# Patient Record
Sex: Female | Born: 1985 | Hispanic: Yes | Marital: Single | Smoking: Never smoker
Health system: Southern US, Community
[De-identification: ages and names within clinical notes are randomized; demographics above are authoritative.]

## PROBLEM LIST (undated history)

## (undated) ENCOUNTER — Inpatient Hospital Stay (HOSPITAL_COMMUNITY): Payer: Self-pay

## (undated) DIAGNOSIS — D509 Iron deficiency anemia, unspecified: Secondary | ICD-10-CM

## (undated) DIAGNOSIS — Z973 Presence of spectacles and contact lenses: Secondary | ICD-10-CM

## (undated) DIAGNOSIS — O139 Gestational [pregnancy-induced] hypertension without significant proteinuria, unspecified trimester: Secondary | ICD-10-CM

## (undated) DIAGNOSIS — O24419 Gestational diabetes mellitus in pregnancy, unspecified control: Secondary | ICD-10-CM

## (undated) DIAGNOSIS — N939 Abnormal uterine and vaginal bleeding, unspecified: Secondary | ICD-10-CM

## (undated) DIAGNOSIS — Z789 Other specified health status: Secondary | ICD-10-CM

## (undated) HISTORY — PX: BREAST ENHANCEMENT SURGERY: SHX7

## (undated) HISTORY — DX: Other specified health status: Z78.9

## (undated) HISTORY — PX: NO PAST SURGERIES: SHX2092

---
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
## 2007-01-26 ENCOUNTER — Inpatient Hospital Stay: Payer: Self-pay | Admitting: Obstetrics and Gynecology

## 2008-07-31 ENCOUNTER — Inpatient Hospital Stay: Payer: Self-pay | Admitting: Obstetrics and Gynecology

## 2011-04-19 ENCOUNTER — Ambulatory Visit: Payer: Self-pay | Admitting: Family Medicine

## 2011-05-28 ENCOUNTER — Ambulatory Visit: Payer: Self-pay | Admitting: Advanced Practice Midwife

## 2012-08-14 ENCOUNTER — Ambulatory Visit: Payer: Self-pay | Admitting: Hematology and Oncology

## 2012-08-25 ENCOUNTER — Ambulatory Visit: Payer: Self-pay | Admitting: Hematology and Oncology

## 2012-08-25 LAB — CBC CANCER CENTER
Basophil #: 0 x10 3/mm (ref 0.0–0.1)
Basophil %: 0.2 %
Eosinophil #: 0 x10 3/mm (ref 0.0–0.7)
Eosinophil %: 0.3 %
HCT: 26 % — ABNORMAL LOW (ref 35.0–47.0)
HGB: 8.2 g/dL — ABNORMAL LOW (ref 12.0–16.0)
Lymphocyte #: 1.7 x10 3/mm (ref 1.0–3.6)
Lymphocyte %: 28 %
MCH: 22.5 pg — ABNORMAL LOW (ref 26.0–34.0)
MCHC: 31.5 g/dL — ABNORMAL LOW (ref 32.0–36.0)
MCV: 72 fL — ABNORMAL LOW (ref 80–100)
Monocyte #: 0.4 x10 3/mm (ref 0.2–0.9)
Monocyte %: 6.9 %
Neutrophil #: 4 x10 3/mm (ref 1.4–6.5)
Neutrophil %: 64.6 %
Platelet: 240 x10 3/mm (ref 150–440)
RBC: 3.64 10*6/uL — ABNORMAL LOW (ref 3.80–5.20)
RDW: 19.5 % — ABNORMAL HIGH (ref 11.5–14.5)
WBC: 6.2 x10 3/mm (ref 3.6–11.0)

## 2012-09-02 ENCOUNTER — Inpatient Hospital Stay: Payer: Self-pay

## 2012-09-03 LAB — CBC WITH DIFFERENTIAL/PLATELET
Basophil #: 0 10*3/uL (ref 0.0–0.1)
Basophil %: 0.3 %
Eosinophil #: 0 10*3/uL (ref 0.0–0.7)
Eosinophil %: 0.4 %
HCT: 28.6 % — ABNORMAL LOW (ref 35.0–47.0)
HGB: 9.3 g/dL — ABNORMAL LOW (ref 12.0–16.0)
Lymphocyte #: 2.3 10*3/uL (ref 1.0–3.6)
Lymphocyte %: 29.7 %
MCH: 24.4 pg — ABNORMAL LOW (ref 26.0–34.0)
MCHC: 32.5 g/dL (ref 32.0–36.0)
MCV: 75 fL — ABNORMAL LOW (ref 80–100)
Monocyte #: 0.5 x10 3/mm (ref 0.2–0.9)
Monocyte %: 6.9 %
Neutrophil #: 4.8 10*3/uL (ref 1.4–6.5)
Neutrophil %: 62.7 %
Platelet: 227 10*3/uL (ref 150–440)
RBC: 3.82 10*6/uL (ref 3.80–5.20)
RDW: 20 % — ABNORMAL HIGH (ref 11.5–14.5)
WBC: 7.6 10*3/uL (ref 3.6–11.0)

## 2012-09-04 LAB — HEMATOCRIT: HCT: 23.8 % — ABNORMAL LOW (ref 35.0–47.0)

## 2012-09-20 ENCOUNTER — Ambulatory Visit: Payer: Self-pay | Admitting: Hematology and Oncology

## 2012-09-22 LAB — CBC CANCER CENTER
Basophil #: 0 x10 3/mm (ref 0.0–0.1)
Basophil %: 0.4 %
Eosinophil #: 0 x10 3/mm (ref 0.0–0.7)
Eosinophil %: 0.8 %
HCT: 33.1 % — ABNORMAL LOW (ref 35.0–47.0)
HGB: 10.7 g/dL — ABNORMAL LOW (ref 12.0–16.0)
Lymphocyte #: 1.8 x10 3/mm (ref 1.0–3.6)
Lymphocyte %: 33.8 %
MCH: 26 pg (ref 26.0–34.0)
MCHC: 32.3 g/dL (ref 32.0–36.0)
MCV: 81 fL (ref 80–100)
Monocyte #: 0.3 x10 3/mm (ref 0.2–0.9)
Monocyte %: 5.3 %
Neutrophil #: 3.1 x10 3/mm (ref 1.4–6.5)
Neutrophil %: 59.7 %
Platelet: 175 x10 3/mm (ref 150–440)
RBC: 4.1 10*6/uL (ref 3.80–5.20)
RDW: 28.5 % — ABNORMAL HIGH (ref 11.5–14.5)
WBC: 5.3 x10 3/mm (ref 3.6–11.0)

## 2012-10-21 ENCOUNTER — Ambulatory Visit: Payer: Self-pay | Admitting: Hematology and Oncology

## 2013-01-12 ENCOUNTER — Ambulatory Visit: Payer: Self-pay | Admitting: Hematology and Oncology

## 2015-02-28 NOTE — H&P (Signed)
L&D Evaluation:  History:   HPI 29 y/o [id number] @ 38/5 wks EDC 09/11/12 arrives with c/o contractions beginning this evening, denies leakiing fluid or vaginal bleeding, baby is actrive. Cae @ [facility] HX LGA infants 3-8#+infants and 9#14. HX PPH after 1 delivery and after SAB. HX PPD 2nd baby. GBS negative.    Presents with contractions    Patient's Medical History No Chronic Illness    Patient's Surgical History none    Medications Pre Natal Vitamins    Allergies NKDA    Social History none    Family History Non-Contributory   ROS:   ROS All systems were reviewed.  HEENT, CNS, GI, GU, Respiratory, CV, Renal and Musculoskeletal systems were found to be normal.   Exam:   Vital Signs stable    Urine Protein not completed    General no apparent distress    Mental Status clear    Chest clear    Heart normal sinus rhythm    Abdomen gravid, non-tender    Estimated Fetal Weight Average for gestational age    Fetal Position vtx    Fundal Height term    Back no CVAT    Edema no edema    Reflexes 1+    Clonus negative    Pelvic no external lesions, 5cm vtx @ -1 well applied  AROM clear fluid nl show    Description clear    FHT normal rate with no decels, baseline 130's ag variability with accels    Fetal Heart Rate 136    Ucx regular, q 2/3 mins 45/60 sec moderate    Skin dry    Lymph no lymphadenopathy   Impression:   Impression active labor   Plan:   Plan monitor contractions and for cervical change    Comments Admitted, knows what to expect. Requests epidural, anesthesia notified. FOB supportive at bedside.   Electronic Signatures: Rosie Fate (CNM)  (Signed [phone] 01:04)  Authored: L&D Evaluation   Last Updated: 14-Nov-13 01:04 by Rosie Fate (CNM)

## 2016-01-22 ENCOUNTER — Emergency Department: Payer: Self-pay

## 2016-01-22 ENCOUNTER — Encounter: Payer: Self-pay | Admitting: Emergency Medicine

## 2016-01-22 ENCOUNTER — Emergency Department
Admission: EM | Admit: 2016-01-22 | Discharge: 2016-01-22 | Disposition: A | Discharge status: Home / Self Care | Payer: Self-pay | Attending: Emergency Medicine | Admitting: Emergency Medicine

## 2016-01-22 DIAGNOSIS — N12 Tubulo-interstitial nephritis, not specified as acute or chronic: Secondary | ICD-10-CM | POA: Insufficient documentation

## 2016-01-22 DIAGNOSIS — R1011 Right upper quadrant pain: Secondary | ICD-10-CM | POA: Insufficient documentation

## 2016-01-22 LAB — URINALYSIS COMPLETE WITH MICROSCOPIC (ARMC ONLY)
Bilirubin Urine: NEGATIVE
Glucose, UA: NEGATIVE mg/dL
Ketones, ur: NEGATIVE mg/dL
Nitrite: POSITIVE — AB
Protein, ur: 100 mg/dL — AB
Specific Gravity, Urine: 1.016 (ref 1.005–1.030)
pH: 8 (ref 5.0–8.0)

## 2016-01-22 LAB — HEPATIC FUNCTION PANEL
ALT: 26 U/L (ref 14–54)
AST: 23 U/L (ref 15–41)
Albumin: 4.1 g/dL (ref 3.5–5.0)
Alkaline Phosphatase: 60 U/L (ref 38–126)
Bilirubin, Direct: 0.1 mg/dL (ref 0.1–0.5)
Indirect Bilirubin: 0.4 mg/dL (ref 0.3–0.9)
Total Bilirubin: 0.5 mg/dL (ref 0.3–1.2)
Total Protein: 8.2 g/dL — ABNORMAL HIGH (ref 6.5–8.1)

## 2016-01-22 LAB — BASIC METABOLIC PANEL
Anion gap: 6 (ref 5–15)
BUN: 10 mg/dL (ref 6–20)
CO2: 26 mmol/L (ref 22–32)
Calcium: 9.2 mg/dL (ref 8.9–10.3)
Chloride: 101 mmol/L (ref 101–111)
Creatinine, Ser: 0.52 mg/dL (ref 0.44–1.00)
GFR calc Af Amer: >60 mL/min (ref >60)
GFR calc non Af Amer: >60 mL/min (ref >60)
Glucose, Bld: 101 mg/dL — ABNORMAL HIGH (ref 65–99)
Potassium: 3.1 mmol/L — ABNORMAL LOW (ref 3.5–5.1)
Sodium: 133 mmol/L — ABNORMAL LOW (ref 135–145)

## 2016-01-22 LAB — CBC
HCT: 37.1 % (ref 35.0–47.0)
Hemoglobin: 12.8 g/dL (ref 12.0–16.0)
MCH: 30.3 pg (ref 26.0–34.0)
MCHC: 34.5 g/dL (ref 32.0–36.0)
MCV: 87.9 fL (ref 80.0–100.0)
Platelets: 207 10*3/uL (ref 150–440)
RBC: 4.22 MIL/uL (ref 3.80–5.20)
RDW: 13.8 % (ref 11.5–14.5)
WBC: 9 10*3/uL (ref 3.6–11.0)

## 2016-01-22 LAB — LIPASE, BLOOD: Lipase: 18 U/L (ref 11–51)

## 2016-01-22 LAB — POCT PREGNANCY, URINE: Preg Test, Ur: NEGATIVE

## 2016-01-22 MED ORDER — HYDROMORPHONE HCL 1 MG/ML IJ SOLN
INTRAMUSCULAR | Status: AC
  Filled 2016-01-22: qty 1

## 2016-01-22 MED ORDER — ONDANSETRON 4 MG PO TBDP
8.0000 mg | ORAL_TABLET | Freq: Once | ORAL | Status: AC
  Administered 2016-01-22: 8 mg via ORAL

## 2016-01-22 MED ORDER — SULFAMETHOXAZOLE-TRIMETHOPRIM 800-160 MG PO TABS
ORAL_TABLET | ORAL | Status: AC
  Filled 2016-01-22: qty 1

## 2016-01-22 MED ORDER — SULFAMETHOXAZOLE-TRIMETHOPRIM 800-160 MG PO TABS
1.0000 | ORAL_TABLET | Freq: Once | ORAL | Status: AC
  Administered 2016-01-22: 1 via ORAL

## 2016-01-22 MED ORDER — ONDANSETRON 4 MG PO TBDP
ORAL_TABLET | ORAL | Status: AC
  Filled 2016-01-22: qty 2

## 2016-01-22 MED ORDER — HYDROMORPHONE HCL 1 MG/ML IJ SOLN
1.0000 mg | Freq: Once | INTRAMUSCULAR | Status: AC
Start: 2016-01-22 — End: 2016-01-22
  Administered 2016-01-22: 1 mg via INTRAMUSCULAR

## 2016-01-22 MED ORDER — PROMETHAZINE HCL 25 MG PO TABS: 25.0000 mg | ORAL_TABLET | Freq: Four times a day (QID) | ORAL | Status: DC | PRN

## 2016-01-22 MED ORDER — SULFAMETHOXAZOLE-TRIMETHOPRIM 800-160 MG PO TABS: 1.0000 | ORAL_TABLET | Freq: Two times a day (BID) | ORAL | Status: DC

## 2016-01-22 MED ORDER — OXYCODONE-ACETAMINOPHEN 5-325 MG PO TABS
1.0000 | ORAL_TABLET | Freq: Four times a day (QID) | ORAL | Status: DC | PRN
Start: 2016-01-22 — End: 2016-05-02

## 2016-01-22 NOTE — ED Notes (Signed)
Pt reports fever and right sided flank pain x2 days, reports nausea and decreased urination.

## 2016-01-22 NOTE — ED Notes (Signed)
UA Preg = Negative

## 2016-01-22 NOTE — ED Notes (Signed)
Pt presents to ED with c/o urinary frequency since last Friday morning. Pt reports right flank and abdominal pain, denies painful urination or hesitancy. Pt denies any similar s/x's, denies h/x of kidney stones or renal dysfunction.

## 2016-01-22 NOTE — Discharge Instructions (Signed)
You were prescribed a medication that is potentially sedating. Do not drink alcohol, drive or participate in any other potentially dangerous activities while taking this medication as it may make you sleepy. Do not take this medication with any other sedating medications, either prescription or over-the-counter. If you were prescribed Percocet or Vicodin, do not take these with acetaminophen (Tylenol) as it is already contained within these medications.   Opioid pain medications (or "narcotics") can be habit forming.  Use it as little as possible to achieve adequate pain control.  Do not use or use it with extreme caution if you have a history of opiate abuse or dependence.  If you are on a pain contract with your primary care doctor or a pain specialist, be sure to let them know you were prescribed this medication today from the [facility] Emergency Department.  This medication is intended for your use only - do not give any to anyone else and keep it in a secure place where nobody else, especially children and pets, have access to it.  It will also cause or worsen constipation, so you may want to consider taking an over-the-counter stool softener while you are taking this medication.  Pyelonephritis, Adult Pyelonephritis is a kidney infection. The kidneys are the organs that filter a person's blood and move waste out of the bloodstream and into the urine. Urine passes from the kidneys, through the ureters, and into the bladder. There are two main types of pyelonephritis:  Infections that come on quickly without any warning (acute pyelonephritis).  Infections that last for a long period of time (chronic pyelonephritis). In most cases, the infection clears up with treatment and does not cause further problems. More severe infections or chronic infections can sometimes spread to the bloodstream or lead to other problems with the kidneys. CAUSES This condition is usually caused by:  Bacteria  traveling from the bladder to the kidney through infected urine. The urine in the bladder can become infected with bacteria from:  Bladder infection (cystitis).  Inflammation of the prostate gland (prostatitis).  Sexual intercourse, in females.  Bacteria traveling from the bloodstream to the kidney. RISK FACTORS This condition is more likely to develop in:  Pregnant women.  Older people.  People who have diabetes.  People who have kidney stones or bladder stones.  People who have other abnormalities of the kidney or ureter.  People who have a catheter placed in the bladder.  People who have cancer.  People who are sexually active.  Women who use spermicides.  People who have had a prior urinary tract infection. SYMPTOMS Symptoms of this condition include:  Frequent urination.  Strong or persistent urge to urinate.  Burning or stinging when urinating.  Abdominal pain.  Back pain.  Pain in the side or flank area.  Fever.  Chills.  Blood in the urine, or dark urine.  Nausea.  Vomiting. DIAGNOSIS This condition may be diagnosed based on:  Medical history and physical exam.  Urine tests.  Blood tests. You may also have imaging tests of the kidneys, such as an ultrasound or CT scan. TREATMENT Treatment for this condition may depend on the severity of the infection.  If the infection is mild and is found early, you may be treated with antibiotic medicines taken by mouth. You will need to drink fluids to remain hydrated.  If the infection is more severe, you may need to stay in the hospital and receive antibiotics given directly into a vein through an  IV tube. You may also need to receive fluids through an IV tube if you are not able to remain hydrated. After your hospital stay, you may need to take oral antibiotics for a period of time. Other treatments may be required, depending on the cause of the infection. HOME CARE INSTRUCTIONS Medicines  Take  over-the-counter and prescription medicines only as told by your health care provider.  If you were prescribed an antibiotic medicine, take it as told by your health care provider. Do not stop taking the antibiotic even if you start to feel better. General Instructions  Drink enough fluid to keep your urine clear or pale yellow.  Avoid caffeine, tea, and carbonated beverages. They tend to irritate the bladder.  Urinate often. Avoid holding in urine for long periods of time.  Urinate before and after sex.  After a bowel movement, women should cleanse from front to back. Use each tissue only once.  Keep all follow-up visits as told by your health care provider. This is important. SEEK MEDICAL CARE IF:  Your symptoms do not get better after 2 days of treatment.  Your symptoms get worse.  You have a fever. SEEK IMMEDIATE MEDICAL CARE IF:  You are unable to take your antibiotics or fluids.  You have shaking chills.  You vomit.  You have severe flank or back pain.  You have extreme weakness or fainting.   This information is not intended to replace advice given to you by your health care provider. Make sure you discuss any questions you have with your health care provider.   Document Released: 10/07/2005 Document Revised: 06/28/2015 Document Reviewed: 01/30/2015 Elsevier Interactive Patient Education [employer].

## 2016-01-22 NOTE — ED Provider Notes (Signed)
[facility] Emergency Department Provider Note  ____________________________________________  Time seen: 6:50 PM  I have reviewed the triage vital signs and the nursing notes.   HISTORY  Chief Complaint Flank Pain    HPI Carrie Cameron is a 30 y.o. female who complains of urinary frequency and urgency for the past 3-4 days. No history of kidney stones. Pain has progressed to the right flank and right side of the abdomen. Has some nausea but no vomiting. Positive subjective fever over the past 2 days as well. No aggravating or alleviating factors to the abdominal pain. It's moderate intensity, aching, constant and gradually worsening.     History reviewed. No pertinent past medical history.   There are no active problems to display for this patient.    History reviewed. No pertinent past surgical history.   Current Outpatient Rx  Name  Route  Sig  Dispense  Refill  . oxyCODONE-acetaminophen (ROXICET) 5-325 MG tablet   Oral   Take 1 tablet by mouth every 6 (six) hours as needed for severe pain.   12 tablet   0   . promethazine (PHENERGAN) 25 MG tablet   Oral   Take 1 tablet (25 mg total) by mouth every 6 (six) hours as needed for nausea or vomiting.   15 tablet   0   . sulfamethoxazole-trimethoprim (BACTRIM DS) 800-160 MG tablet   Oral   Take 1 tablet by mouth 2 (two) times daily.   14 tablet   0      Allergies Review of patient's allergies indicates no known allergies.   No family history on file.  Social History Social History  Substance Use Topics  . Smoking status: Never Smoker   . Smokeless tobacco: None  . Alcohol Use: No    Review of Systems  Constitutional:   Positive fever and chills. No weight changes Eyes:   No vision changes.  ENT:   No sore throat. No rhinorrhea. Cardiovascular:   No chest pain. Respiratory:   No dyspnea or cough. Gastrointestinal:   Positive right-sided abdominal pain without  vomiting and diarrhea.  No BRBPR or melena. Genitourinary:   Positive urinary frequency without dysuria or hematuria Musculoskeletal:   Negative for focal pain or swelling Skin:   Negative for rash. Neurological:   Negative for headaches, focal weakness or numbness.  10-point ROS otherwise negative.  ____________________________________________   PHYSICAL EXAM:  VITAL SIGNS: ED Triage Vitals  Enc Vitals Group     BP 01/22/16 1803 118/84 mmHg     Pulse Rate 01/22/16 1803 88     Resp 01/22/16 1803 16     Temp 01/22/16 1803 99.5 F (37.5 C)     Temp Source 01/22/16 1803 Oral     SpO2 01/22/16 1803 100 %     Weight 01/22/16 1803 147 lb (66.679 kg)     Height 01/22/16 1803 5' (1.524 m)     Head Cir --      Peak Flow --      Pain Score 01/22/16 1803 8     Pain Loc --      Pain Edu? --      Excl. in [place]? --     Vital signs reviewed, nursing assessments reviewed.   Constitutional:   Alert and oriented. Well appearing and in no distress. Eyes:   No scleral icterus. No conjunctival pallor. PERRL. EOMI ENT   Head:   Normocephalic and atraumatic.   Nose:   No congestion/rhinnorhea.  No septal hematoma   Mouth/Throat:   MMM, no pharyngeal erythema. No peritonsillar mass.    Neck:   No stridor. No SubQ emphysema. No meningismus. Hematological/Lymphatic/Immunilogical:   No cervical lymphadenopathy. Cardiovascular:   RRR. Symmetric bilateral radial and DP pulses.  No murmurs.  Respiratory:   Normal respiratory effort without tachypnea nor retractions. Breath sounds are clear and equal bilaterally. No wheezes/rales/rhonchi. Gastrointestinal:   Soft with significant right mid abdomen and right upper quadrant tenderness.. Non distended. There is right CVA tenderness. There is suprapubic tenderness as well.  No rebound, rigidity, or guarding. Genitourinary:   deferred Musculoskeletal:   Nontender with normal range of motion in all extremities. No joint effusions.  No lower  extremity tenderness.  No edema. Neurologic:   Normal speech and language.  CN 2-10 normal. Motor grossly intact. No gross focal neurologic deficits are appreciated.  Skin:    Skin is warm, dry and intact. No rash noted.  No petechiae, purpura, or bullae. Psychiatric:   Mood and affect are normal. ____________________________________________    LABS (pertinent positives/negatives) (all labs ordered are listed, but only abnormal results are displayed) Labs Reviewed  BASIC METABOLIC PANEL - Abnormal; Notable for the following:    Sodium 133 (*)    Potassium 3.1 (*)    Glucose, Bld 101 (*)    All other components within normal limits  URINALYSIS COMPLETEWITH MICROSCOPIC ([facility] ONLY) - Abnormal; Notable for the following:    Color, Urine YELLOW (*)    APPearance HAZY (*)    Hgb urine dipstick 3+ (*)    Protein, ur 100 (*)    Nitrite POSITIVE (*)    Leukocytes, UA 1+ (*)    Bacteria, UA MANY (*)    Squamous Epithelial / LPF 0-5 (*)    All other components within normal limits  HEPATIC FUNCTION PANEL - Abnormal; Notable for the following:    Total Protein 8.2 (*)    All other components within normal limits  CBC  LIPASE, BLOOD  POC URINE PREG, ED  POCT PREGNANCY, URINE   ____________________________________________   EKG    ____________________________________________    RADIOLOGY  Ultrasound right upper quadrant unremarkable.  ____________________________________________   PROCEDURES   ____________________________________________   INITIAL IMPRESSION / ASSESSMENT AND PLAN / ED COURSE  Pertinent labs & imaging results that were available during my care of the patient were reviewed by me and considered in my medical decision making (see chart for details).  Patient presents with symptoms concerning for UTI. Exam is concerning for pyelonephritis versus cholecystitis. With that as tender she is in the upper abdomen in the right upper quadrant, we proceeded with  ultrasound which was negative. Labs also were reassuring regarding the biliary system. Vital signs are overall unremarkable except for a low-grade fever. Urinalysis is strongly consistent with UTI establishing pyelonephritis. We'll start the patient on Bactrim, Zofran and Percocet as needed for symptom relief, follow-up with primary care in 1 week. Urine culture sent.     ____________________________________________   FINAL CLINICAL IMPRESSION(S) / ED DIAGNOSES  Final diagnoses:  Pyelonephritis      Carrie Mew, MD 01/22/16 2130

## 2016-01-25 LAB — URINE CULTURE: Culture: >=100000 — AB

## 2016-05-02 ENCOUNTER — Encounter: Payer: Self-pay | Admitting: Obstetrics & Gynecology

## 2016-05-02 ENCOUNTER — Ambulatory Visit (INDEPENDENT_AMBULATORY_CARE_PROVIDER_SITE_OTHER): Payer: BLUE CROSS/BLUE SHIELD | Admitting: Obstetrics & Gynecology

## 2016-05-02 VITALS — BP 122/79 | HR 76 | Wt 132.0 lb

## 2016-05-02 DIAGNOSIS — Z36 Encounter for antenatal screening of mother: Secondary | ICD-10-CM | POA: Diagnosis not present

## 2016-05-02 DIAGNOSIS — Z3481 Encounter for supervision of other normal pregnancy, first trimester: Secondary | ICD-10-CM

## 2016-05-02 DIAGNOSIS — Z641 Problems related to multiparity: Secondary | ICD-10-CM

## 2016-05-02 DIAGNOSIS — Z3490 Encounter for supervision of normal pregnancy, unspecified, unspecified trimester: Secondary | ICD-10-CM

## 2016-05-02 NOTE — Progress Notes (Signed)
   Subjective:    Patient ID: Carrie Cameron, female    DOB: Mar 27, 1986, 30 y.o.   MRN: [id number]  HPI  30 yo P5 here for a NOB visit.   Review of Systems     Objective:   Physical Exam Her bedside u/s shows a 6 week fetus with normal FHR       Assessment & Plan:  Early pregnancy Schedule NOB visit for 9-10 weeks

## 2016-05-02 NOTE — Progress Notes (Signed)
Bedside Transvaginal US performed, SIUP noted with +FHR measuring [redacted]w[redacted]d, pt will return in 3 weeks for follow-up.

## 2016-05-03 ENCOUNTER — Telehealth: Payer: Self-pay | Admitting: *Deleted

## 2016-05-03 NOTE — Telephone Encounter (Signed)
Pt is currently [redacted]wks pregnant, had office visit yesterday and transvaginal US performed.  Pt called this morning c/o dark brown spotting but declines any cramping or pelvic pain.  Informed pt that it could have been related to her exam yesterday and to do pelvic rest over the weekend and call back on Monday if any thing has changed or her bleeding increases and becomes bright red.

## 2016-05-22 ENCOUNTER — Encounter: Payer: Self-pay | Admitting: Obstetrics and Gynecology

## 2016-05-22 ENCOUNTER — Telehealth: Payer: Self-pay | Admitting: *Deleted

## 2016-05-22 NOTE — Telephone Encounter (Signed)
-----   Message from Francia Greaves sent at 05/22/2016  9:22 AM EDT ----- Regarding: Appt Called patient to see if she need to reschedule appt, states she had a miscarriage at home, recommend a follow up office visit?

## 2016-05-22 NOTE — Telephone Encounter (Signed)
Spoke to pt, stated that the Sunday after she spoke to me about spotting on Friday that she started to have heavy vaginal bleeding like a menstrual cycle and did so for about a week.  Verified that pt was not Rh neg and had not received Rhogam in the past and patient acknowledged.  Informed pt to call back in 4-6 weeks if she did not have a normal menstrual cycle per Dr Ilda Basset recommendation.  Pt acknowledged.

## 2016-05-23 ENCOUNTER — Encounter: Payer: BLUE CROSS/BLUE SHIELD | Admitting: Obstetrics & Gynecology

## 2016-05-27 DIAGNOSIS — N10 Acute tubulo-interstitial nephritis: Secondary | ICD-10-CM | POA: Diagnosis not present

## 2016-05-28 ENCOUNTER — Ambulatory Visit (INDEPENDENT_AMBULATORY_CARE_PROVIDER_SITE_OTHER): Payer: BLUE CROSS/BLUE SHIELD | Admitting: Family Medicine

## 2016-05-28 ENCOUNTER — Encounter: Payer: Self-pay | Admitting: Family Medicine

## 2016-05-28 VITALS — BP 101/70 | HR 101 | Temp 97.7°F | Resp 20 | Wt 132.0 lb

## 2016-05-28 DIAGNOSIS — O039 Complete or unspecified spontaneous abortion without complication: Secondary | ICD-10-CM

## 2016-05-28 DIAGNOSIS — N12 Tubulo-interstitial nephritis, not specified as acute or chronic: Secondary | ICD-10-CM

## 2016-05-28 DIAGNOSIS — R109 Unspecified abdominal pain: Secondary | ICD-10-CM

## 2016-05-28 DIAGNOSIS — R1010 Upper abdominal pain, unspecified: Secondary | ICD-10-CM | POA: Diagnosis not present

## 2016-05-28 LAB — POCT URINALYSIS DIPSTICK
Glucose, UA: NEGATIVE
Nitrite, UA: NEGATIVE
Spec Grav, UA: 1.02
Urobilinogen, UA: 0.2
pH, UA: 6

## 2016-05-28 MED ORDER — SULFAMETHOXAZOLE-TRIMETHOPRIM 800-160 MG PO TABS: 1.0000 | ORAL_TABLET | Freq: Two times a day (BID) | ORAL | 0 refills | Status: DC

## 2016-05-28 NOTE — Progress Notes (Signed)
Pt here today to follow up SAB, Currently c/o fever and left flank pain for 4 days.

## 2016-05-28 NOTE — Progress Notes (Signed)
   Subjective:    Patient ID: Carrie Cameron is a 30 y.o. female presenting with SAB Follow-up  on 05/28/2016  HPI: Patient returns after probable SAB. Seen for first visit with IUP with flicker, then began bleeding. Has bled x 2 wks. Just stopped. She is reporting 4 day onset dysuria, frequency and fever and left flank pain. No h/o kidney stones.  Review of Systems  Constitutional: Negative for chills and fever.  Respiratory: Negative for shortness of breath.   Cardiovascular: Negative for chest pain.  Gastrointestinal: Negative for abdominal pain, nausea and vomiting.  Genitourinary: Negative for dysuria.  Skin: Negative for rash.      Objective:    BP 101/70 (BP Location: Left Arm, Patient Position: Sitting, Cuff Size: Normal)   Pulse (!) 101   Temp 97.7 F (36.5 C) (Oral)   Resp 20   Wt 132 lb (59.9 kg)   LMP 03/11/2016   BMI 25.78 kg/m  Physical Exam  Constitutional: She is oriented to person, place, and time. She appears well-developed and well-nourished. No distress.  HENT:  Head: Normocephalic and atraumatic.  Eyes: No scleral icterus.  Neck: Neck supple.  Cardiovascular: Normal rate.   Pulmonary/Chest: Effort normal.  Abdominal: Soft.  Musculoskeletal:  Left CVA tenderness noted  Neurological: She is alert and oriented to person, place, and time.  Skin: Skin is warm and dry.  Psychiatric: She has a normal mood and affect.        Assessment & Plan:   Problem List Items Addressed This Visit    None    Visit Diagnoses    Flank pain, acute    -  Primary   Relevant Orders   POCT Urinalysis Dipstick (Completed)   Urine Culture   Pyelonephritis       10 days of Septra--first dose tonight and tomorrow am--if not better, return Thursday for f/u. If N/V, go to ER.   Relevant Medications   sulfamethoxazole-trimethoprim (BACTRIM DS,SEPTRA DS) 800-160 MG tablet   SAB (spontaneous abortion)       Usual risks discussed--ok to get pregnant right away.          Return if symptoms worsen or fail to improve.  Donel Osowski S 05/28/2016 4:37 PM

## 2016-05-28 NOTE — Patient Instructions (Signed)

## 2016-05-30 LAB — URINE CULTURE

## 2016-06-26 ENCOUNTER — Other Ambulatory Visit (INDEPENDENT_AMBULATORY_CARE_PROVIDER_SITE_OTHER): Payer: BLUE CROSS/BLUE SHIELD

## 2016-06-26 DIAGNOSIS — Z3201 Encounter for pregnancy test, result positive: Secondary | ICD-10-CM

## 2016-06-26 LAB — POCT URINE PREGNANCY: Preg Test, Ur: POSITIVE — AB

## 2016-06-26 NOTE — Progress Notes (Signed)
Pt had a SAB approximately 5 weeks ago, states she has not had a menstrual cycle since then and took an UPT at home which was positive.  Dr Ilda Basset had noted in a previous phone message that if the patient menstrual cycle had not returned in 4-6 weeks to contact the office for follow-up. UPT + in office, BHCG drawn, pt will follow-up on Friday morning for repeat BHCG.

## 2016-06-26 NOTE — Addendum Note (Signed)
Addended by: Ricka Burdock on: 06/26/2016 08:25 AM   Modules accepted: Orders

## 2016-06-27 ENCOUNTER — Telehealth: Payer: Self-pay | Admitting: *Deleted

## 2016-06-27 LAB — HCG, QUANTITATIVE, PREGNANCY: hCG, Beta Chain, Quant, S: 78 m[IU]/mL — ABNORMAL HIGH

## 2016-06-27 NOTE — Telephone Encounter (Signed)
Spoke to pt about Beta result. Advised pt to keep appt for tomorrow to draw again for comparison.

## 2016-06-28 ENCOUNTER — Ambulatory Visit (INDEPENDENT_AMBULATORY_CARE_PROVIDER_SITE_OTHER): Payer: BLUE CROSS/BLUE SHIELD | Admitting: *Deleted

## 2016-06-28 DIAGNOSIS — Z3201 Encounter for pregnancy test, result positive: Secondary | ICD-10-CM

## 2016-06-29 LAB — HCG, QUANTITATIVE, PREGNANCY: hCG, Beta Chain, Quant, S: 269.2 m[IU]/mL — ABNORMAL HIGH

## 2016-07-01 ENCOUNTER — Telehealth: Payer: Self-pay | Admitting: *Deleted

## 2016-07-01 NOTE — Telephone Encounter (Signed)
-----   Message from Gretchen Short, [place] sent at 07/01/2016 10:13 AM EDT ----- Juluis Rainier ----- Message ----- From: Osborne Oman, MD Sent: 07/01/2016  10:08 AM To: Gretchen Short, CMA  Her HCG is rising..new pregnancy??  Any symptoms?  Needs recheck in one week.

## 2016-07-01 NOTE — Telephone Encounter (Signed)
Spoke to pt, informed her of rise in HCG so this could be a possible new pregnancy, pt states she is not having any pregnancy symptoms at this point.  Scheduled repeat BHCG on 07-08-16.

## 2016-07-08 ENCOUNTER — Other Ambulatory Visit (INDEPENDENT_AMBULATORY_CARE_PROVIDER_SITE_OTHER): Payer: BLUE CROSS/BLUE SHIELD | Admitting: *Deleted

## 2016-07-08 DIAGNOSIS — Z3201 Encounter for pregnancy test, result positive: Secondary | ICD-10-CM | POA: Diagnosis not present

## 2016-07-08 NOTE — Progress Notes (Signed)
Pt here today for Rpt BHCG, possible early pregnancy.  Had SAB approximately 6 weeks ago.

## 2016-07-09 ENCOUNTER — Telehealth: Payer: Self-pay | Admitting: *Deleted

## 2016-07-09 LAB — HCG, QUANTITATIVE, PREGNANCY: hCG, Beta Chain, Quant, S: 16001.9 m[IU]/mL — ABNORMAL HIGH

## 2016-07-09 NOTE — Telephone Encounter (Signed)
Informed pt of BHCG result and that the increase indicates a new pregnancy, scheduled new OB visit for 07-30-16.  Reviewed miscarriage and ectopic precautions and instructed to call the office if she experiences any change in symptoms.

## 2016-07-16 ENCOUNTER — Encounter: Payer: BLUE CROSS/BLUE SHIELD | Admitting: Obstetrics & Gynecology

## 2016-07-30 ENCOUNTER — Encounter: Payer: BLUE CROSS/BLUE SHIELD | Admitting: Family Medicine

## 2016-07-30 ENCOUNTER — Encounter: Payer: Self-pay | Admitting: Family Medicine

## 2016-08-07 ENCOUNTER — Encounter: Payer: BLUE CROSS/BLUE SHIELD | Admitting: Family Medicine

## 2016-11-26 ENCOUNTER — Ambulatory Visit (INDEPENDENT_AMBULATORY_CARE_PROVIDER_SITE_OTHER): Payer: BLUE CROSS/BLUE SHIELD | Admitting: Obstetrics & Gynecology

## 2016-11-26 ENCOUNTER — Encounter: Payer: Self-pay | Admitting: Obstetrics & Gynecology

## 2016-11-26 VITALS — BP 123/78 | HR 67 | Wt 137.0 lb

## 2016-11-26 DIAGNOSIS — A749 Chlamydial infection, unspecified: Secondary | ICD-10-CM

## 2016-11-26 DIAGNOSIS — N898 Other specified noninflammatory disorders of vagina: Secondary | ICD-10-CM

## 2016-11-26 DIAGNOSIS — Z0000 Encounter for general adult medical examination without abnormal findings: Secondary | ICD-10-CM | POA: Diagnosis not present

## 2016-11-26 DIAGNOSIS — R87610 Atypical squamous cells of undetermined significance on cytologic smear of cervix (ASC-US): Secondary | ICD-10-CM | POA: Diagnosis not present

## 2016-11-26 DIAGNOSIS — Z01419 Encounter for gynecological examination (general) (routine) without abnormal findings: Secondary | ICD-10-CM

## 2016-11-26 DIAGNOSIS — N939 Abnormal uterine and vaginal bleeding, unspecified: Secondary | ICD-10-CM

## 2016-11-26 MED ORDER — METRONIDAZOLE 500 MG PO TABS: 500.0000 mg | ORAL_TABLET | Freq: Two times a day (BID) | ORAL | 0 refills | Status: DC

## 2016-11-26 NOTE — Patient Instructions (Signed)
Thank you for enrolling in [facility]. Please follow the instructions below to securely access your online medical record. MyChart allows you to send messages to your doctor, view your test results, manage appointments, and more.   How Do I Sign Up? 1. In your Internet browser, go to [employer] and enter [email or web address]. 2. Click on the Sign Up Now link in the Sign In box. You will see the New Member Sign Up page. 3. Enter your MyChart Access Code exactly as it appears below. You will not need to use this code after you've completed the sign-up process. If you do not sign up before the expiration date, you must request a new code.  MyChart Access Code: [email or web address] Expires: 01/25/2017 11:43 AM  4. Enter your Social Security Number ([id number]) and Date of Birth (mm/dd/yyyy) as indicated and click Submit. You will be taken to the next sign-up page. 5. Create a MyChart ID. This will be your MyChart login ID and cannot be changed, so think of one that is secure and easy to remember. 6. Create a MyChart password. You can change your password at any time. 7. Enter your Password Reset Question and Answer. This can be used at a later time if you forget your password.  8. Enter your e-mail address. You will receive e-mail notification when new information is available in [place]. 9. Click Sign Up. You can now view your medical record.   Additional Information Remember, MyChart is NOT to be used for urgent needs. For medical emergencies, dial 911.

## 2016-11-26 NOTE — Progress Notes (Signed)
GYNECOLOGY ANNUAL PREVENTATIVE CARE ENCOUNTER NOTE  Subjective:   Carrie Cameron is a 31 y.o. [phone] female here for a routine annual gynecologic exam.  Current complaints: had SAB 10/17 and has been spotting since and has had no normal cycle. Was on OCPs for about 2 months, nothing since 12/17.   Also reports foul-smelling vaginal discharge for a few weeks, feels she has an infection.   Denies pelvic pain, problems with intercourse or other gynecologic concerns.    Gynecologic History No LMP recorded. Contraception: none Last Pap: Unknown. Results were: normal  Obstetric History OB History  Gravida Para Term Preterm AB Living  9 5 5  0 4 5  SAB TAB Ectopic Multiple Live Births  4 0 0 0 5    # Outcome Date GA Lbr Len/2nd Weight Sex Delivery Anes PTL Lv  9 Term 09/03/12 [redacted]w[redacted]d  7 lb (3.175 kg) M Vag-Spont   LIV  8 SAB 2013 [redacted]w[redacted]d         7 Term 07/31/08 [redacted]w[redacted]d  10 lb (4.536 kg) F    LIV  6 Term 01/27/07 [redacted]w[redacted]d  8 lb (3.629 kg) F    LIV  5 SAB 2007 [redacted]w[redacted]d         4 Term 06/03/04 [redacted]w[redacted]d  8 lb (3.629 kg) M Vag-Spont   LIV  3 Term 11/25/02 [redacted]w[redacted]d  8 lb (3.629 kg) M Vag-Spont   LIV     Complications: Postpartum hemorrhage  2 SAB      SAB     1 SAB      SAB         Past Medical History:  Diagnosis Date  . Medical history non-contributory     Past Surgical History:  Procedure Laterality Date  . NO PAST SURGERIES      Current Outpatient Prescriptions on File Prior to Visit  Medication Sig Dispense Refill  . acetaminophen (TYLENOL) 500 MG tablet Take 500 mg by mouth every 6 (six) hours as needed.     No current facility-administered medications on file prior to visit.     No Known Allergies  Social History   Social History  . Marital status: Single    Spouse name: N/A  . Number of children: N/A  . Years of education: N/A   Occupational History  . Not on file.   Social History Main Topics  . Smoking status: Never Smoker  . Smokeless tobacco: Never Used  .  Alcohol use No  . Drug use: No  . Sexual activity: Yes    Birth control/ protection: Pill   Other Topics Concern  . Not on file   Social History Narrative  . No narrative on file    History reviewed. No pertinent family history.  The following portions of the patient's history were reviewed and updated as appropriate: allergies, current medications, past family history, past medical history, past social history, past surgical history and problem list.  Review of Systems Pertinent items noted in HPI and remainder of comprehensive ROS otherwise negative.   Objective:  BP 123/78   Pulse 67   Wt 137 lb (62.1 kg)   Breastfeeding? Unknown   BMI 26.76 kg/m  CONSTITUTIONAL: Well-developed, well-nourished female in no acute distress.  HENT:  Normocephalic, atraumatic, External right and left ear normal. Oropharynx is clear and moist EYES: Conjunctivae and EOM are normal. Pupils are equal, round, and reactive to light. No scleral icterus.  NECK: Normal range of motion, supple, no masses.  Normal thyroid.  SKIN: Skin is warm and dry. No rash noted. Not diaphoretic. No erythema. No pallor. NEUROLOGIC: Alert and oriented to person, place, and time. Normal reflexes, muscle tone coordination. No cranial nerve deficit noted. PSYCHIATRIC: Normal mood and affect. Normal behavior. Normal judgment and thought content. CARDIOVASCULAR: Normal heart rate noted, regular rhythm RESPIRATORY: Clear to auscultation bilaterally. Effort and breath sounds normal, no problems with respiration noted. BREASTS: Symmetric in size. No masses, skin changes, nipple drainage, or lymphadenopathy. ABDOMEN: Soft, normal bowel sounds, no distention noted.  No tenderness, rebound or guarding.  PELVIC: Normal appearing external genitalia; normal appearing vaginal mucosa and cervix.  Foul-smelling, yellow abnormal discharge noted.  Pap smear obtained; there was small amount of bleeding after pap.  Normal uterine size, no  other palpable masses, no uterine or adnexal tenderness. MUSCULOSKELETAL: Normal range of motion. No tenderness.  No cyanosis, clubbing, or edema.  2+ distal pulses.   Assessment and Plan:  1. Abnormal uterine bleeding (AUB) Likely hormonal given recent SAB and OCP use.  Patient is one month free from exogenous hormones, can observe for next 1-2 months. If no resumption of periods and normal labs (ordered below), may need further evaluation. - TSH - Beta HCG, Quant - Prolactin - Testosterone,Free and Total - Cervicovaginal ancillary only  2. Foul smelling vaginal discharge Likely BV, Metronidazole prescribed. - Cervicovaginal ancillary only - metroNIDAZOLE (FLAGYL) 500 MG tablet; Take 1 tablet (500 mg total) by mouth 2 (two) times daily.  Dispense: 14 tablet; Refill: 0  3. Encounter for gynecological examination with Papanicolaou smear of cervix - Cytology - PAP.  Will follow up results of pap smear and manage accordingly. Routine preventative health maintenance measures emphasized. Please refer to After Visit Summary for other counseling recommendations.    Verita Schneiders, MD, [place] Attending Obstetrician & Gynecologist, [place] for [facility]

## 2016-11-27 LAB — CERVICOVAGINAL ANCILLARY ONLY
Bacterial vaginitis: POSITIVE — AB
Candida vaginitis: NEGATIVE
Chlamydia: POSITIVE — AB
Neisseria Gonorrhea: NEGATIVE
Trichomonas: NEGATIVE

## 2016-11-27 LAB — PROLACTIN: Prolactin: 18 ng/mL (ref 4.8–23.3)

## 2016-11-27 LAB — BETA HCG QUANT (REF LAB): hCG Quant: <1 m[IU]/mL

## 2016-11-27 LAB — TSH: TSH: 1.76 u[IU]/mL (ref 0.450–4.500)

## 2016-11-27 MED ORDER — AZITHROMYCIN 500 MG PO TABS: 1000.0000 mg | ORAL_TABLET | Freq: Once | ORAL | 1 refills | Status: AC

## 2016-11-27 NOTE — Progress Notes (Signed)
Patient has chlamydia.  Recommend testing for other STIs, also needs to let partner(s) know so the partner(s) can get testing and treatment. Patient and sex partner(s) should abstain from unprotected sexual activity for seven days after everyone receives appropriate treatment.  Azithromycin 1000 mg po x 1 was prescribed for patient.  Please call to inform patient of results and recommendations, and advise to pick up prescription.  Of note, the test also showed bacterial vaginitis; patient already received Metronidazole for treatment.  Osborne Oman, MD

## 2016-11-27 NOTE — Addendum Note (Signed)
Addended by: Verita Schneiders A on: 11/27/2016 08:56 PM   Modules accepted: Orders

## 2016-11-28 ENCOUNTER — Telehealth: Payer: Self-pay | Admitting: *Deleted

## 2016-11-28 LAB — TESTOSTERONE,FREE AND TOTAL
Testosterone, Free: 1 pg/mL (ref 0.0–4.2)
Testosterone: 25 ng/dL (ref 8–48)

## 2016-11-28 NOTE — Telephone Encounter (Signed)
Informed pt of results and the need for medication management, instructed to refrain from sexual intercourse until partner was properly treated.  Scheduled TOC on 01-27-17.

## 2016-11-28 NOTE — Telephone Encounter (Signed)
-----   Message from Osborne Oman, MD sent at 11/27/2016  8:58 PM EST ----- Patient has chlamydia.  Recommend testing for other STIs, also needs to let partner(s) know so the partner(s) can get testing and treatment. Patient and sex partner(s) should abstain from unprotected sexual activity for seven days after everyone receives appropriate treatment.  Azithromycin 1000 mg po x 1 was prescribed for patient.  Please call to inform patient of results and recommendations, and advise to pick up prescription.  Of note, the test also showed bacterial vaginitis; patient already received Metronidazole for treatment.  Osborne Oman, MD

## 2016-11-29 LAB — CYTOLOGY - PAP: Diagnosis: NEGATIVE

## 2016-12-12 ENCOUNTER — Telehealth: Payer: Self-pay | Admitting: *Deleted

## 2016-12-12 DIAGNOSIS — B379 Candidiasis, unspecified: Secondary | ICD-10-CM

## 2016-12-12 MED ORDER — FLUCONAZOLE 150 MG PO TABS: 150.0000 mg | ORAL_TABLET | Freq: Once | ORAL | 0 refills | Status: AC

## 2016-12-12 NOTE — Telephone Encounter (Signed)
-----   Message from Blanchie Dessert, [place] sent at 12/12/2016  2:57 PM EST ----- Regarding: Diflucan Rx request Contact: [phone] Pt requesting a Rx for Diflucan for yeast to [facility] in [place]..  Last Seen 11/26/16 w Dr A.   thanks

## 2016-12-12 NOTE — Telephone Encounter (Signed)
Pt was just recently treated with antibiotics for BV and chlamydia, now c/o discharge associated with a yeast infection.  Diflucan sent to pharmacy.

## 2017-01-20 ENCOUNTER — Telehealth: Payer: Self-pay | Admitting: *Deleted

## 2017-01-20 DIAGNOSIS — Z760 Encounter for issue of repeat prescription: Secondary | ICD-10-CM

## 2017-01-20 MED ORDER — NORGESTIMATE-ETH ESTRADIOL 0.25-35 MG-MCG PO TABS: 1.0000 | ORAL_TABLET | Freq: Every day | ORAL | 11 refills | Status: DC

## 2017-01-20 NOTE — Telephone Encounter (Signed)
-----   Message from Osborne Oman, MD sent at 01/20/2017 11:54 AM EDT ----- Sprintec is fine   U[facility] ----- Message ----- From: Gretchen Short, CMA Sent: 01/20/2017  11:49 AM To: Osborne Oman, MD  Pt called in stating she would like to start OCP's. She has been on them before but can't remember what the name of the OCP's were. She says whatever you suggest will be fine. Can we send something in for her since she was just seen about 2 months ago?  Thanks MH

## 2017-01-27 ENCOUNTER — Other Ambulatory Visit (INDEPENDENT_AMBULATORY_CARE_PROVIDER_SITE_OTHER): Payer: BLUE CROSS/BLUE SHIELD | Admitting: *Deleted

## 2017-01-27 DIAGNOSIS — A749 Chlamydial infection, unspecified: Secondary | ICD-10-CM

## 2017-01-27 NOTE — Progress Notes (Signed)
Pt was treated for chlamydia in February, is here today for a TOC.  Self swab performed and specimen sent to lab.

## 2017-01-28 LAB — CERVICOVAGINAL ANCILLARY ONLY
Chlamydia: POSITIVE — AB
Neisseria Gonorrhea: NEGATIVE

## 2017-01-29 ENCOUNTER — Encounter: Payer: Self-pay | Admitting: Obstetrics & Gynecology

## 2017-01-29 DIAGNOSIS — A749 Chlamydial infection, unspecified: Secondary | ICD-10-CM | POA: Insufficient documentation

## 2017-01-29 MED ORDER — AZITHROMYCIN 500 MG PO TABS: 1000.0000 mg | ORAL_TABLET | Freq: Once | ORAL | 1 refills | Status: AC

## 2017-01-29 NOTE — Addendum Note (Signed)
Addended by: Verita Schneiders A on: 01/29/2017 09:32 AM   Modules accepted: Orders

## 2017-01-29 NOTE — Progress Notes (Signed)
Patient was likely reinfected with chlamydia. Recommend testing for other STIs, also needs to let partner(s) know so the partner(s) can get testing and treatment. Patient and sex partner(s) should abstain from unprotected sexual activity for seven days after everyone receives appropriate treatment; this is very important to prevent reinfection.  Azithromycin was prescribed for patient. Will need to come back 4 weeks after treatment for test of cure.  Please call to inform patient of results and recommendations, and advise to pick up prescription.  Osborne Oman, MD

## 2017-02-14 ENCOUNTER — Telehealth: Payer: Self-pay | Admitting: *Deleted

## 2017-02-14 NOTE — Telephone Encounter (Signed)
Spoke to pt. Adv her to come in for eval if no cycle in the next couple of weeks as Plan B can cause irregular cycles. She has not had a positive UPT and no cycle.

## 2017-02-14 NOTE — Telephone Encounter (Signed)
-----   Message from Blanchie Dessert, [place] sent at 02/14/2017  9:38 AM EDT ----- Regarding: spotting Contact: [phone] Pt took Plan B last month, has not had a period this month, only spotting, took preg test, result negative. Wants a call back

## 2017-02-17 ENCOUNTER — Other Ambulatory Visit: Payer: BLUE CROSS/BLUE SHIELD

## 2017-03-03 ENCOUNTER — Other Ambulatory Visit (INDEPENDENT_AMBULATORY_CARE_PROVIDER_SITE_OTHER): Payer: BLUE CROSS/BLUE SHIELD | Admitting: *Deleted

## 2017-03-03 DIAGNOSIS — Z113 Encounter for screening for infections with a predominantly sexual mode of transmission: Secondary | ICD-10-CM

## 2017-03-03 DIAGNOSIS — A749 Chlamydial infection, unspecified: Secondary | ICD-10-CM

## 2017-03-03 NOTE — Addendum Note (Signed)
Addended by: Ricka Burdock on: 03/03/2017 08:24 AM   Modules accepted: Orders

## 2017-03-03 NOTE — Progress Notes (Signed)
Pt is here today for TOC for + CT on 01-27-17.  Self swab performed and specimen sent to lab.  Will notify pt when results are available.

## 2017-03-04 LAB — CERVICOVAGINAL ANCILLARY ONLY
Chlamydia: NEGATIVE
Neisseria Gonorrhea: NEGATIVE

## 2017-03-13 ENCOUNTER — Other Ambulatory Visit (INDEPENDENT_AMBULATORY_CARE_PROVIDER_SITE_OTHER): Payer: BLUE CROSS/BLUE SHIELD | Admitting: *Deleted

## 2017-03-13 DIAGNOSIS — Z3201 Encounter for pregnancy test, result positive: Secondary | ICD-10-CM | POA: Diagnosis not present

## 2017-03-13 DIAGNOSIS — N912 Amenorrhea, unspecified: Secondary | ICD-10-CM

## 2017-03-13 LAB — POCT URINE PREGNANCY: Preg Test, Ur: POSITIVE — AB

## 2017-03-13 NOTE — Progress Notes (Signed)
Pt is here today for a pregnancy test due to amenorrhea but has had light spotting for a day or two.  Took the Plan B in February and has not had a menstrual cycle since then.  UPT + here in office, TV US performed since pt was unsure of dates.  Gestational sac noted measuring 5 wks, no yolk sac or fetal pole present at this time.  Instructed pt to start a PNV and to come back in 3 weeks for confirmation.  Also instructed to call the office if she starts to experience any heavy vaginal bleeding or severe pain that continues to worsen. Pt acknowledged instructions.

## 2017-04-04 ENCOUNTER — Ambulatory Visit (INDEPENDENT_AMBULATORY_CARE_PROVIDER_SITE_OTHER): Payer: BLUE CROSS/BLUE SHIELD | Admitting: Obstetrics and Gynecology

## 2017-04-04 ENCOUNTER — Encounter: Payer: Self-pay | Admitting: Obstetrics and Gynecology

## 2017-04-04 DIAGNOSIS — Z3480 Encounter for supervision of other normal pregnancy, unspecified trimester: Secondary | ICD-10-CM | POA: Diagnosis not present

## 2017-04-04 DIAGNOSIS — Z3689 Encounter for other specified antenatal screening: Secondary | ICD-10-CM

## 2017-04-04 DIAGNOSIS — Z113 Encounter for screening for infections with a predominantly sexual mode of transmission: Secondary | ICD-10-CM

## 2017-04-04 DIAGNOSIS — Z3483 Encounter for supervision of other normal pregnancy, third trimester: Secondary | ICD-10-CM

## 2017-04-04 NOTE — Progress Notes (Signed)
DATING AND VIABILITY SONOGRAM   Carrie Cameron is a 31 y.o. year old [phone] with LMP No LMP recorded (lmp unknown). Patient is pregnant. which would correlate to  [redacted]w[redacted]d weeks gestation.  She has irregular menstrual cycles.   She is here today for a confirmatory initial sonogram.    GESTATION: SINGLETON [redacted]w[redacted]d     FETAL ACTIVITY:          Heart rate         148          The fetus is active.   GESTATIONAL AGE AND  BIOMETRICS:  Gestational criteria: Estimated Date of Delivery: 11/18/17 by early ultrasound now at [redacted]w[redacted]d  Previous Scans: 0 CROWN RUMP LENGTH           1.23 cm         7.3 weeks                                                   AVERAGE EGA(BY THIS SCAN):  [redacted]w[redacted]d  WORKING EDD( early ultrasound ):  11/18/2017     TECHNICIAN COMMENTS:  SLIUP measuring [redacted]w[redacted]d by CRL  with FHR 148bpm   A copy of this report including all images has been saved and backed up to a second source for retrieval if needed. All measures and details of the anatomical scan, placentation, fluid volume and pelvic anatomy are contained in that report.  Mandy Hutchinson 04/04/2017 10:32 AM

## 2017-04-04 NOTE — Progress Notes (Signed)
New OB Note  04/04/2017   Clinic: [facility]  Chief Complaint: NOB  Transfer of Care Patient: no  History of Present Illness: Ms. Carrie Cameron is a 31 y.o. [id number] @ 7/3 weeks ([place]ark 1[birth year], based on 7wk u/s today). LMP unsure. Preg complicated by has Chlamydia infection and Supervision of other normal pregnancy, antepartum on her problem list.   Any events prior to today's visit: no Her periods were: irregular She was using Plan B when she conceived.  She has Negative signs or symptoms of nausea/vomiting of pregnancy. She has Negative signs or symptoms of miscarriage or preterm labor On any different medications around the time she conceived/early pregnancy: No   ROS: A 12-point review of systems was performed and negative, except as stated in the above HPI.  OBGYN History: As per HPI. OB History  Gravida Para Term Preterm AB Living  9 5 5  0 3 5  SAB TAB Ectopic Multiple Live Births  3 0 0 0 5    # Outcome Date GA Lbr Len/2nd Weight Sex Delivery Anes PTL Lv  9 Current           8 SAB 07/21/16     SAB     7 Term 09/03/12 [redacted]w[redacted]d  7 lb (3.175 kg) M Vag-Spont   LIV  6 SAB 2013 [redacted]w[redacted]d         5 Term 07/31/08 [redacted]w[redacted]d  10 lb (4.536 kg) F    LIV  4 Term 01/27/07 [redacted]w[redacted]d  8 lb (3.629 kg) F    LIV  3 SAB 2007 [redacted]w[redacted]d         2 Term 06/03/04 [redacted]w[redacted]d  8 lb (3.629 kg) M Vag-Spont   LIV  1 Term 11/25/02 [redacted]w[redacted]d  8 lb (3.629 kg) M Vag-Spont   LIV     Complications: Postpartum hemorrhage      Any issues with any prior pregnancies: no Prior children are healthy, doing well, and without any problems or issues: yes History of pap smears: Yes. Last pap smear 2018 and results were NILM History of STIs: Yes   Past Medical History: Past Medical History:  Diagnosis Date  . Medical history non-contributory     Past Surgical History: Past Surgical History:  Procedure Laterality Date  . NO PAST SURGERIES      Family History:  History reviewed. No pertinent family  history. She denies any female cancers, bleeding or blood clotting disorders.  She denies any history of mental retardation, birth defects or genetic disorders in her or the FOB's history  Social History:  Social History   Social History  . Marital status: Single    Spouse name: N/A  . Number of children: N/A  . Years of education: N/A   Occupational History  . Not on file.   Social History Main Topics  . Smoking status: Never Smoker  . Smokeless tobacco: Never Used  . Alcohol use No  . Drug use: No  . Sexual activity: Yes    Birth control/ protection: Pill   Other Topics Concern  . Not on file   Social History Narrative  . No narrative on file     Allergy: No Known Allergies  Health Maintenance:  Mammogram Up to Date: not applicable  Current Outpatient Medications: PNV  Physical Exam:   BP 115/78   Pulse 71   Wt 144 lb (65.3 kg)   LMP  (LMP Unknown)   BMI 28.12 kg/m  Body mass index is 28.12  kg/m. Contractions: Not present Vag. Bleeding: None. FHTs: 140s  General appearance: Well nourished, well developed female in no acute distress.  Neck:  Supple, normal appearance, and no thyromegaly  Cardiovascular: S1, S2 normal, no murmur, rub or gallop, regular rate and rhythm Respiratory:  Clear to auscultation bilateral. Normal respiratory effort Abdomen: positive bowel sounds and no masses, hernias; diffusely non tender to palpation, non distended Neuro/Psych:  Normal mood and affect.  Skin:  Warm and dry.   Laboratory: none  Imaging:  SLIUP @ 7/3wks  Assessment: pt doing well  Plan: 1. Supervision of other normal pregnancy, antepartum Routine care. Declines genetics. Interventions given if has n/v in pregnancy later in pregnancy d/w pt. - Culture, OB Urine - Obstetric Panel, Including HIV - Urine cytology ancillary only - [place] bedside; Future  Problem list reviewed and updated.  Follow up in 4 weeks.  >50% of 20 min visit spent on counseling  and coordination of care.     Durene Romans MD Attending Center for [facility])

## 2017-04-05 ENCOUNTER — Encounter: Payer: Self-pay | Admitting: Obstetrics and Gynecology

## 2017-04-05 DIAGNOSIS — E663 Overweight: Secondary | ICD-10-CM | POA: Insufficient documentation

## 2017-04-05 LAB — OBSTETRIC PANEL, INCLUDING HIV
Antibody Screen: NEGATIVE
Basophils Absolute: 0 10*3/uL (ref 0.0–0.2)
Basos: 0 %
EOS (ABSOLUTE): 0 10*3/uL (ref 0.0–0.4)
Eos: 0 %
HIV Screen 4th Generation wRfx: NONREACTIVE
Hematocrit: 36.5 % (ref 34.0–46.6)
Hemoglobin: 11.7 g/dL (ref 11.1–15.9)
Hepatitis B Surface Ag: NEGATIVE
Immature Grans (Abs): 0 10*3/uL (ref 0.0–0.1)
Immature Granulocytes: 0 %
Lymphocytes Absolute: 2 10*3/uL (ref 0.7–3.1)
Lymphs: 30 %
MCH: 27.5 pg (ref 26.6–33.0)
MCHC: 32.1 g/dL (ref 31.5–35.7)
MCV: 86 fL (ref 79–97)
Monocytes Absolute: 0.5 10*3/uL (ref 0.1–0.9)
Monocytes: 8 %
Neutrophils Absolute: 4.1 10*3/uL (ref 1.4–7.0)
Neutrophils: 62 %
Platelets: 254 10*3/uL (ref 150–379)
RBC: 4.26 x10E6/uL (ref 3.77–5.28)
RDW: 16.3 % — ABNORMAL HIGH (ref 12.3–15.4)
RPR Ser Ql: NONREACTIVE
Rh Factor: POSITIVE
Rubella Antibodies, IGG: 1.66 index (ref >0.99)
WBC: 6.7 10*3/uL (ref 3.4–10.8)

## 2017-04-07 LAB — URINE CYTOLOGY ANCILLARY ONLY
Chlamydia: NEGATIVE
Neisseria Gonorrhea: NEGATIVE
Trichomonas: NEGATIVE

## 2017-04-08 LAB — URINE CULTURE, OB REFLEX

## 2017-04-08 LAB — CULTURE, OB URINE

## 2017-04-09 ENCOUNTER — Encounter: Payer: Self-pay | Admitting: Obstetrics and Gynecology

## 2017-04-09 ENCOUNTER — Telehealth: Payer: Self-pay | Admitting: *Deleted

## 2017-04-09 DIAGNOSIS — N390 Urinary tract infection, site not specified: Secondary | ICD-10-CM

## 2017-04-09 DIAGNOSIS — B9620 Unspecified Escherichia coli [E. coli] as the cause of diseases classified elsewhere: Secondary | ICD-10-CM

## 2017-04-09 DIAGNOSIS — O2340 Unspecified infection of urinary tract in pregnancy, unspecified trimester: Secondary | ICD-10-CM | POA: Insufficient documentation

## 2017-04-09 MED ORDER — CEPHALEXIN 500 MG PO CAPS: 500.0000 mg | ORAL_CAPSULE | Freq: Three times a day (TID) | ORAL | 0 refills | Status: DC

## 2017-04-09 NOTE — Telephone Encounter (Signed)
-----   Message from Aletha Halim, MD sent at 04/09/2017  9:48 AM EDT ----- Please send her in keflex per protocol. thanks

## 2017-05-02 ENCOUNTER — Ambulatory Visit (INDEPENDENT_AMBULATORY_CARE_PROVIDER_SITE_OTHER): Payer: BLUE CROSS/BLUE SHIELD | Admitting: Family Medicine

## 2017-05-02 VITALS — BP 99/64 | HR 74 | Wt 143.0 lb

## 2017-05-02 DIAGNOSIS — O2341 Unspecified infection of urinary tract in pregnancy, first trimester: Secondary | ICD-10-CM

## 2017-05-02 DIAGNOSIS — A749 Chlamydial infection, unspecified: Secondary | ICD-10-CM

## 2017-05-02 DIAGNOSIS — Z3481 Encounter for supervision of other normal pregnancy, first trimester: Secondary | ICD-10-CM

## 2017-05-02 DIAGNOSIS — Z3480 Encounter for supervision of other normal pregnancy, unspecified trimester: Secondary | ICD-10-CM

## 2017-05-02 DIAGNOSIS — Z641 Problems related to multiparity: Secondary | ICD-10-CM

## 2017-05-02 DIAGNOSIS — O2340 Unspecified infection of urinary tract in pregnancy, unspecified trimester: Secondary | ICD-10-CM

## 2017-05-02 NOTE — Progress Notes (Signed)
   PRENATAL VISIT NOTE  Subjective:  Carrie Cameron is a 31 y.o. [phone] at [redacted]w[redacted]d being seen today for ongoing prenatal care.  She is currently monitored for the following issues for this low-risk pregnancy and has Chlamydia infection; Supervision of other normal pregnancy, antepartum; Overweight (BMI 25.0-29.9); and Urinary tract infection in pregnancy, antepartum on her problem list.  Patient reports no complaints.  Contractions: Not present. Vag. Bleeding: None.  Movement: Absent. Denies leaking of fluid.   The following portions of the patient's history were reviewed and updated as appropriate: allergies, current medications, past family history, past medical history, past social history, past surgical history and problem list. Problem list updated.  Objective:   Vitals:   05/02/17 1005  BP: 99/64  Pulse: 74  Weight: 143 lb (64.9 kg)    Fetal Status: Fetal Heart Rate (bpm): 173   Movement: Absent     General:  Alert, oriented and cooperative. Patient is in no acute distress.  Skin: Skin is warm and dry. No rash noted.   Cardiovascular: Normal heart rate noted  Respiratory: Normal respiratory effort, no problems with respiration noted  Abdomen: Soft, gravid, appropriate for gestational age. Pain/Pressure: Present     Pelvic:  Cervical exam deferred        Extremities: Normal range of motion.  Edema: None  Mental Status: Normal mood and affect. Normal behavior. Normal judgment and thought content.   Assessment and Plan:  Pregnancy: [id number] at [redacted]w[redacted]d  1. Supervision of other normal pregnancy, antepartum Baby scripts to be activated today Declined genetic testing Order anatomy - US MFM OB DETAIL +14 WK; Future  2. Urinary tract infection in pregnancy, antepartum TOC today - Culture, OB Urine  3. Chlamydia infection TOC negative at [facility]  Preterm labor symptoms and general obstetric precautions including but not limited to vaginal bleeding, contractions, leaking  of fluid and fetal movement were reviewed in detail with the patient. Please refer to After Visit Summary for other counseling recommendations.  Return in 8 weeks (on 06/27/2017).   Donnamae Jude, MD

## 2017-05-02 NOTE — Patient Instructions (Signed)
 Second Trimester of Pregnancy The second trimester is from week 14 through week 27 (months 4 through 6). The second trimester is often a time when you feel your best. Your body has adjusted to being pregnant, and you begin to feel better physically. Usually, morning sickness has lessened or quit completely, you may have more energy, and you may have an increase in appetite. The second trimester is also a time when the fetus is growing rapidly. At the end of the sixth month, the fetus is about 9 inches long and weighs about 1 pounds. You will likely begin to feel the baby move (quickening) between 16 and 20 weeks of pregnancy. Body changes during your second trimester Your body continues to go through many changes during your second trimester. The changes vary from woman to woman.  Your weight will continue to increase. You will notice your lower abdomen bulging out.  You may begin to get stretch marks on your hips, abdomen, and breasts.  You may develop headaches that can be relieved by medicines. The medicines should be approved by your health care provider.  You may urinate more often because the fetus is pressing on your bladder.  You may develop or continue to have heartburn as a result of your pregnancy.  You may develop constipation because certain hormones are causing the muscles that push waste through your intestines to slow down.  You may develop hemorrhoids or swollen, bulging veins (varicose veins).  You may have back pain. This is caused by: ? Weight gain. ? Pregnancy hormones that are relaxing the joints in your pelvis. ? A shift in weight and the muscles that support your balance.  Your breasts will continue to grow and they will continue to become tender.  Your gums may bleed and may be sensitive to brushing and flossing.  Dark spots or blotches (chloasma, mask of pregnancy) may develop on your face. This will likely fade after the baby is born.  A dark line from  your belly button to the pubic area (linea nigra) may appear. This will likely fade after the baby is born.  You may have changes in your hair. These can include thickening of your hair, rapid growth, and changes in texture. Some women also have hair loss during or after pregnancy, or hair that feels dry or thin. Your hair will most likely return to normal after your baby is born.  What to expect at prenatal visits During a routine prenatal visit:  You will be weighed to make sure you and the fetus are growing normally.  Your blood pressure will be taken.  Your abdomen will be measured to track your baby's growth.  The fetal heartbeat will be listened to.  Any test results from the previous visit will be discussed.  Your health care provider may ask you:  How you are feeling.  If you are feeling the baby move.  If you have had any abnormal symptoms, such as leaking fluid, bleeding, severe headaches, or abdominal cramping.  If you are using any tobacco products, including cigarettes, chewing tobacco, and electronic cigarettes.  If you have any questions.  Other tests that may be performed during your second trimester include:  Blood tests that check for: ? Low iron levels (anemia). ? High blood sugar that affects pregnant women (gestational diabetes) between 24 and 28 weeks. ? Rh antibodies. This is to check for a protein on red blood cells (Rh factor).  Urine tests to check for infections, diabetes,   or protein in the urine.  An ultrasound to confirm the proper growth and development of the baby.  An amniocentesis to check for possible genetic problems.  Fetal screens for spina bifida and Down syndrome.  HIV (human immunodeficiency virus) testing. Routine prenatal testing includes screening for HIV, unless you choose not to have this test.  Follow these instructions at home: Medicines  Follow your health care provider's instructions regarding medicine use. Specific  medicines may be either safe or unsafe to take during pregnancy.  Take a prenatal vitamin that contains at least 600 micrograms (mcg) of folic acid.  If you develop constipation, try taking a stool softener if your health care provider approves. Eating and drinking  Eat a balanced diet that includes fresh fruits and vegetables, whole grains, good sources of protein such as meat, eggs, or tofu, and low-fat dairy. Your health care provider will help you determine the amount of weight gain that is right for you.  Avoid raw meat and uncooked cheese. These carry germs that can cause birth defects in the baby.  If you have low calcium intake from food, talk to your health care provider about whether you should take a daily calcium supplement.  Limit foods that are high in fat and processed sugars, such as fried and sweet foods.  To prevent constipation: ? Drink enough fluid to keep your urine clear or pale yellow. ? Eat foods that are high in fiber, such as fresh fruits and vegetables, whole grains, and beans. Activity  Exercise only as directed by your health care provider. Most women can continue their usual exercise routine during pregnancy. Try to exercise for 30 minutes at least 5 days a week. Stop exercising if you experience uterine contractions.  Avoid heavy lifting, wear low heel shoes, and practice good posture.  A sexual relationship may be continued unless your health care provider directs you otherwise. Relieving pain and discomfort  Wear a good support bra to prevent discomfort from breast tenderness.  Take warm sitz baths to soothe any pain or discomfort caused by hemorrhoids. Use hemorrhoid cream if your health care provider approves.  Rest with your legs elevated if you have leg cramps or low back pain.  If you develop varicose veins, wear support hose. Elevate your feet for 15 minutes, 3-4 times a day. Limit salt in your diet. Prenatal Care  Write down your questions.  Take them to your prenatal visits.  Keep all your prenatal visits as told by your health care provider. This is important. Safety  Wear your seat belt at all times when driving.  Make a list of emergency phone numbers, including numbers for family, friends, the hospital, and police and fire departments. General instructions  Ask your health care provider for a referral to a local prenatal education class. Begin classes no later than the beginning of month 6 of your pregnancy.  Ask for help if you have counseling or nutritional needs during pregnancy. Your health care provider can offer advice or refer you to specialists for help with various needs.  Do not use hot tubs, steam rooms, or saunas.  Do not douche or use tampons or scented sanitary pads.  Do not cross your legs for long periods of time.  Avoid cat litter boxes and soil used by cats. These carry germs that can cause birth defects in the baby and possibly loss of the fetus by miscarriage or stillbirth.  Avoid all smoking, herbs, alcohol, and unprescribed drugs. Chemicals in these products   can affect the formation and growth of the baby.  Do not use any products that contain nicotine or tobacco, such as cigarettes and e-cigarettes. If you need help quitting, ask your health care provider.  Visit your dentist if you have not gone yet during your pregnancy. Use a soft toothbrush to brush your teeth and be gentle when you floss. Contact a health care provider if:  You have dizziness.  You have mild pelvic cramps, pelvic pressure, or nagging pain in the abdominal area.  You have persistent nausea, vomiting, or diarrhea.  You have a bad smelling vaginal discharge.  You have pain when you urinate. Get help right away if:  You have a fever.  You are leaking fluid from your vagina.  You have spotting or bleeding from your vagina.  You have severe abdominal cramping or pain.  You have rapid weight gain or weight  loss.  You have shortness of breath with chest pain.  You notice sudden or extreme swelling of your face, hands, ankles, feet, or legs.  You have not felt your baby move in over an hour.  You have severe headaches that do not go away when you take medicine.  You have vision changes. Summary  The second trimester is from week 14 through week 27 (months 4 through 6). It is also a time when the fetus is growing rapidly.  Your body goes through many changes during pregnancy. The changes vary from woman to woman.  Avoid all smoking, herbs, alcohol, and unprescribed drugs. These chemicals affect the formation and growth your baby.  Do not use any tobacco products, such as cigarettes, chewing tobacco, and e-cigarettes. If you need help quitting, ask your health care provider.  Contact your health care provider if you have any questions. Keep all prenatal visits as told by your health care provider. This is important. This information is not intended to replace advice given to you by your health care provider. Make sure you discuss any questions you have with your health care provider. Document Released: 10/01/2001 Document Revised: 03/14/2016 Document Reviewed: 12/08/2012 Elsevier Interactive Patient Education  2017 [employer]   Breastfeeding Deciding to breastfeed is one of the best choices you can make for you and your baby. A change in hormones during pregnancy causes your breast tissue to grow and increases the number and size of your milk ducts. These hormones also allow proteins, sugars, and fats from your blood supply to make breast milk in your milk-producing glands. Hormones prevent breast milk from being released before your baby is born as well as prompt milk flow after birth. Once breastfeeding has begun, thoughts of your baby, as well as his or her sucking or crying, can stimulate the release of milk from your milk-producing glands. Benefits of breastfeeding For Your  Baby  Your first milk (colostrum) helps your baby's digestive system function better.  There are antibodies in your milk that help your baby fight off infections.  Your baby has a lower incidence of asthma, allergies, and sudden infant death syndrome.  The nutrients in breast milk are better for your baby than infant formulas and are designed uniquely for your baby's needs.  Breast milk improves your baby's brain development.  Your baby is less likely to develop other conditions, such as childhood obesity, asthma, or type 2 diabetes mellitus.  For You  Breastfeeding helps to create a very special bond between you and your baby.  Breastfeeding is convenient. Breast milk is always available at   the correct temperature and costs nothing.  Breastfeeding helps to burn calories and helps you lose the weight gained during pregnancy.  Breastfeeding makes your uterus contract to its prepregnancy size faster and slows bleeding (lochia) after you give birth.  Breastfeeding helps to lower your risk of developing type 2 diabetes mellitus, osteoporosis, and breast or ovarian cancer later in life.  Signs that your baby is hungry Early Signs of Hunger  Increased alertness or activity.  Stretching.  Movement of the head from side to side.  Movement of the head and opening of the mouth when the corner of the mouth or cheek is stroked (rooting).  Increased sucking sounds, smacking lips, cooing, sighing, or squeaking.  Hand-to-mouth movements.  Increased sucking of fingers or hands.  Late Signs of Hunger  Fussing.  Intermittent crying.  Extreme Signs of Hunger Signs of extreme hunger will require calming and consoling before your baby will be able to breastfeed successfully. Do not wait for the following signs of extreme hunger to occur before you initiate breastfeeding:  Restlessness.  A loud, strong cry.  Screaming.  Breastfeeding basics Breastfeeding Initiation  Find a  comfortable place to sit or lie down, with your neck and back well supported.  Place a pillow or rolled up blanket under your baby to bring him or her to the level of your breast (if you are seated). Nursing pillows are specially designed to help support your arms and your baby while you breastfeed.  Make sure that your baby's abdomen is facing your abdomen.  Gently massage your breast. With your fingertips, massage from your chest wall toward your nipple in a circular motion. This encourages milk flow. You may need to continue this action during the feeding if your milk flows slowly.  Support your breast with 4 fingers underneath and your thumb above your nipple. Make sure your fingers are well away from your nipple and your baby's mouth.  Stroke your baby's lips gently with your finger or nipple.  When your baby's mouth is open wide enough, quickly bring your baby to your breast, placing your entire nipple and as much of the colored area around your nipple (areola) as possible into your baby's mouth. ? More areola should be visible above your baby's upper lip than below the lower lip. ? Your baby's tongue should be between his or her lower gum and your breast.  Ensure that your baby's mouth is correctly positioned around your nipple (latched). Your baby's lips should create a seal on your breast and be turned out (everted).  It is common for your baby to suck about 2-3 minutes in order to start the flow of breast milk.  Latching Teaching your baby how to latch on to your breast properly is very important. An improper latch can cause nipple pain and decreased milk supply for you and poor weight gain in your baby. Also, if your baby is not latched onto your nipple properly, he or she may swallow some air during feeding. This can make your baby fussy. Burping your baby when you switch breasts during the feeding can help to get rid of the air. However, teaching your baby to latch on properly is  still the best way to prevent fussiness from swallowing air while breastfeeding. Signs that your baby has successfully latched on to your nipple:  Silent tugging or silent sucking, without causing you pain.  Swallowing heard between every 3-4 sucks.  Muscle movement above and in front of his or her   ears while sucking.  Signs that your baby has not successfully latched on to nipple:  Sucking sounds or smacking sounds from your baby while breastfeeding.  Nipple pain.  If you think your baby has not latched on correctly, slip your finger into the corner of your baby's mouth to break the suction and place it between your baby's gums. Attempt breastfeeding initiation again. Signs of Successful Breastfeeding Signs from your baby:  A gradual decrease in the number of sucks or complete cessation of sucking.  Falling asleep.  Relaxation of his or her body.  Retention of a small amount of milk in his or her mouth.  Letting go of your breast by himself or herself.  Signs from you:  Breasts that have increased in firmness, weight, and size 1-3 hours after feeding.  Breasts that are softer immediately after breastfeeding.  Increased milk volume, as well as a change in milk consistency and color by the fifth day of breastfeeding.  Nipples that are not sore, cracked, or bleeding.  Signs That Your Baby is Getting Enough Milk  Wetting at least 1-2 diapers during the first 24 hours after birth.  Wetting at least 5-6 diapers every 24 hours for the first week after birth. The urine should be clear or pale yellow by 5 days after birth.  Wetting 6-8 diapers every 24 hours as your baby continues to grow and develop.  At least 3 stools in a 24-hour period by age 5 days. The stool should be soft and yellow.  At least 3 stools in a 24-hour period by age 7 days. The stool should be seedy and yellow.  No loss of weight greater than 10% of birth weight during the first 3 days of age.  Average  weight gain of 4-7 ounces (113-198 g) per week after age 4 days.  Consistent daily weight gain by age 5 days, without weight loss after the age of 2 weeks.  After a feeding, your baby may spit up a small amount. This is common. Breastfeeding frequency and duration Frequent feeding will help you make more milk and can prevent sore nipples and breast engorgement. Breastfeed when you feel the need to reduce the fullness of your breasts or when your baby shows signs of hunger. This is called "breastfeeding on demand." Avoid introducing a pacifier to your baby while you are working to establish breastfeeding (the first 4-6 weeks after your baby is born). After this time you may choose to use a pacifier. Research has shown that pacifier use during the first year of a baby's life decreases the risk of sudden infant death syndrome (SIDS). Allow your baby to feed on each breast as long as he or she wants. Breastfeed until your baby is finished feeding. When your baby unlatches or falls asleep while feeding from the first breast, offer the second breast. Because newborns are often sleepy in the first few weeks of life, you may need to awaken your baby to get him or her to feed. Breastfeeding times will vary from baby to baby. However, the following rules can serve as a guide to help you ensure that your baby is properly fed:  Newborns (babies 4 weeks of age or younger) may breastfeed every 1-3 hours.  Newborns should not go longer than 3 hours during the day or 5 hours during the night without breastfeeding.  You should breastfeed your baby a minimum of 8 times in a 24-hour period until you begin to introduce solid foods to your   baby at around 6 months of age.  Breast milk pumping Pumping and storing breast milk allows you to ensure that your baby is exclusively fed your breast milk, even at times when you are unable to breastfeed. This is especially important if you are going back to work while you are still  breastfeeding or when you are not able to be present during feedings. Your lactation consultant can give you guidelines on how long it is safe to store breast milk. A breast pump is a machine that allows you to pump milk from your breast into a sterile bottle. The pumped breast milk can then be stored in a refrigerator or freezer. Some breast pumps are operated by hand, while others use electricity. Ask your lactation consultant which type will work best for you. Breast pumps can be purchased, but some hospitals and breastfeeding support groups lease breast pumps on a monthly basis. A lactation consultant can teach you how to hand express breast milk, if you prefer not to use a pump. Caring for your breasts while you breastfeed Nipples can become dry, cracked, and sore while breastfeeding. The following recommendations can help keep your breasts moisturized and healthy:  Avoid using soap on your nipples.  Wear a supportive bra. Although not required, special nursing bras and tank tops are designed to allow access to your breasts for breastfeeding without taking off your entire bra or top. Avoid wearing underwire-style bras or extremely tight bras.  Air dry your nipples for 3-4minutes after each feeding.  Use only cotton bra pads to absorb leaked breast milk. Leaking of breast milk between feedings is normal.  Use lanolin on your nipples after breastfeeding. Lanolin helps to maintain your skin's normal moisture barrier. If you use pure lanolin, you do not need to wash it off before feeding your baby again. Pure lanolin is not toxic to your baby. You may also hand express a few drops of breast milk and gently massage that milk into your nipples and allow the milk to air dry.  In the first few weeks after giving birth, some women experience extremely full breasts (engorgement). Engorgement can make your breasts feel heavy, warm, and tender to the touch. Engorgement peaks within 3-5 days after you give  birth. The following recommendations can help ease engorgement:  Completely empty your breasts while breastfeeding or pumping. You may want to start by applying warm, moist heat (in the shower or with warm water-soaked hand towels) just before feeding or pumping. This increases circulation and helps the milk flow. If your baby does not completely empty your breasts while breastfeeding, pump any extra milk after he or she is finished.  Wear a snug bra (nursing or regular) or tank top for 1-2 days to signal your body to slightly decrease milk production.  Apply ice packs to your breasts, unless this is too uncomfortable for you.  Make sure that your baby is latched on and positioned properly while breastfeeding.  If engorgement persists after 48 hours of following these recommendations, contact your health care provider or a lactation consultant. Overall health care recommendations while breastfeeding  Eat healthy foods. Alternate between meals and snacks, eating 3 of each per day. Because what you eat affects your breast milk, some of the foods may make your baby more irritable than usual. Avoid eating these foods if you are sure that they are negatively affecting your baby.  Drink milk, fruit juice, and water to satisfy your thirst (about 10 glasses a day).    Rest often, relax, and continue to take your prenatal vitamins to prevent fatigue, stress, and anemia.  Continue breast self-awareness checks.  Avoid chewing and smoking tobacco. Chemicals from cigarettes that pass into breast milk and exposure to secondhand smoke may harm your baby.  Avoid alcohol and drug use, including marijuana. Some medicines that may be harmful to your baby can pass through breast milk. It is important to ask your health care provider before taking any medicine, including all over-the-counter and prescription medicine as well as vitamin and herbal supplements. It is possible to become pregnant while breastfeeding.  If birth control is desired, ask your health care provider about options that will be safe for your baby. Contact a health care provider if:  You feel like you want to stop breastfeeding or have become frustrated with breastfeeding.  You have painful breasts or nipples.  Your nipples are cracked or bleeding.  Your breasts are red, tender, or warm.  You have a swollen area on either breast.  You have a fever or chills.  You have nausea or vomiting.  You have drainage other than breast milk from your nipples.  Your breasts do not become full before feedings by the fifth day after you give birth.  You feel sad and depressed.  Your baby is too sleepy to eat well.  Your baby is having trouble sleeping.  Your baby is wetting less than 3 diapers in a 24-hour period.  Your baby has less than 3 stools in a 24-hour period.  Your baby's skin or the white part of his or her eyes becomes yellow.  Your baby is not gaining weight by 5 days of age. Get help right away if:  Your baby is overly tired (lethargic) and does not want to wake up and feed.  Your baby develops an unexplained fever. This information is not intended to replace advice given to you by your health care provider. Make sure you discuss any questions you have with your health care provider. Document Released: 10/07/2005 Document Revised: 03/20/2016 Document Reviewed: 03/31/2013 Elsevier Interactive Patient Education  2017 [employer]  

## 2017-05-14 ENCOUNTER — Telehealth: Payer: Self-pay | Admitting: Family Medicine

## 2017-05-14 DIAGNOSIS — B373 Candidiasis of vulva and vagina: Secondary | ICD-10-CM

## 2017-05-14 DIAGNOSIS — B3731 Acute candidiasis of vulva and vagina: Secondary | ICD-10-CM

## 2017-05-14 NOTE — Telephone Encounter (Signed)
Patient calls because she's having vaginal odor, discharge, burning and itching. She believes that she's got a yeast infection from the antibiotic from the UTI. [facility] uses the [facility] in [place]. Please advise.

## 2017-05-15 MED ORDER — MICONAZOLE NITRATE 2 % VA CREA: 1.0000 | TOPICAL_CREAM | Freq: Every day | VAGINAL | 2 refills | Status: DC

## 2017-06-20 ENCOUNTER — Ambulatory Visit (HOSPITAL_COMMUNITY): Payer: BLUE CROSS/BLUE SHIELD

## 2017-06-24 ENCOUNTER — Encounter: Payer: BLUE CROSS/BLUE SHIELD | Admitting: Obstetrics and Gynecology

## 2017-06-24 ENCOUNTER — Encounter: Payer: Self-pay | Admitting: Obstetrics and Gynecology

## 2017-06-24 NOTE — Progress Notes (Signed)
Patient did not keep OB appointment for 06/24/2017.  Durene Romans MD Attending Center for [employer])

## 2017-07-15 ENCOUNTER — Telehealth: Payer: Self-pay | Admitting: *Deleted

## 2017-07-15 DIAGNOSIS — B373 Candidiasis of vulva and vagina: Secondary | ICD-10-CM

## 2017-07-15 DIAGNOSIS — B3731 Acute candidiasis of vulva and vagina: Secondary | ICD-10-CM

## 2017-07-15 MED ORDER — FLUCONAZOLE 150 MG PO TABS: 150.0000 mg | ORAL_TABLET | Freq: Once | ORAL | 0 refills | Status: AC

## 2017-07-15 NOTE — Telephone Encounter (Signed)
-----   Message from Blanchie Dessert, [place] sent at 07/15/2017 11:48 AM EDT ----- Contact: [phone] Pt states that she is having itching and burning and requesting that [facility] be called into [facility]

## 2017-07-16 ENCOUNTER — Other Ambulatory Visit (INDEPENDENT_AMBULATORY_CARE_PROVIDER_SITE_OTHER): Payer: BLUE CROSS/BLUE SHIELD

## 2017-07-16 DIAGNOSIS — Z202 Contact with and (suspected) exposure to infections with a predominantly sexual mode of transmission: Secondary | ICD-10-CM

## 2017-07-16 DIAGNOSIS — N898 Other specified noninflammatory disorders of vagina: Secondary | ICD-10-CM

## 2017-07-17 LAB — CERVICOVAGINAL ANCILLARY ONLY
Bacterial vaginitis: POSITIVE — AB
Candida vaginitis: NEGATIVE
Chlamydia: NEGATIVE
Neisseria Gonorrhea: NEGATIVE
Trichomonas: NEGATIVE

## 2017-07-21 ENCOUNTER — Telehealth: Payer: Self-pay | Admitting: *Deleted

## 2017-07-21 DIAGNOSIS — N760 Acute vaginitis: Secondary | ICD-10-CM

## 2017-07-21 DIAGNOSIS — B9689 Other specified bacterial agents as the cause of diseases classified elsewhere: Secondary | ICD-10-CM

## 2017-07-21 MED ORDER — METRONIDAZOLE 500 MG PO TABS: 500.0000 mg | ORAL_TABLET | Freq: Two times a day (BID) | ORAL | 0 refills | Status: DC

## 2017-07-21 NOTE — Telephone Encounter (Signed)
-----   Message from Emily Filbert, MD sent at 07/21/2017  8:31 AM EDT ----- She was + for BV. Can you please prescribe flagyl? Thanks

## 2017-10-21 NOTE — L&D Delivery Note (Signed)
       Delivery Note   Carrie Cameron is a 32 y.o. [id number] at [redacted]w[redacted]d Estimated Date of Delivery: 06/12/18  PRE-OPERATIVE DIAGNOSIS:  1) [redacted]w[redacted]d pregnancy.  2) grand multip 3) H/O macrosomia 4) H/O gestational DM  POST-OPERATIVE DIAGNOSIS:  1) [redacted]w[redacted]d pregnancy s/p Vaginal, Spontaneous    Delivery Type: Vaginal, Spontaneous    Delivery Anesthesia: Epidural   Labor Complications:   None    ESTIMATED BLOOD LOSS: 100  ml    FINDINGS:   1) female infant, Apgar scores of 8    at 1 minute and 9    at 5 minutes and a birthweight of    ounces.    2) Nuchal cord: Yes  SPECIMENS:   PLACENTA:   Appearance: Intact    Removal: Spontaneous      Disposition:     DISPOSITION:  Infant to left in stable condition in the delivery room, with L&D personnel and mother,  NARRATIVE SUMMARY: Labor course:  Carrie Cameron is a [id number] at [redacted]w[redacted]d who presented for labor management.  She progressed well in labor with pitocin.  She received the appropriate anesthesia and proceeded to complete dilation. She evidenced good maternal expulsive effort during the second stage. She went on to deliver a viable infant. The placenta delivered without problems and was noted to be complete. A perineal and vaginal examination was performed. Episiotomy/Lacerations: None    [name], M.D. 05/22/2018 2:23 AM

## 2017-11-11 ENCOUNTER — Ambulatory Visit (INDEPENDENT_AMBULATORY_CARE_PROVIDER_SITE_OTHER): Payer: BLUE CROSS/BLUE SHIELD | Admitting: Obstetrics & Gynecology

## 2017-11-11 ENCOUNTER — Encounter: Payer: Self-pay | Admitting: *Deleted

## 2017-11-11 VITALS — BP 113/74 | HR 69 | Wt 149.0 lb

## 2017-11-11 DIAGNOSIS — N898 Other specified noninflammatory disorders of vagina: Secondary | ICD-10-CM | POA: Diagnosis not present

## 2017-11-11 DIAGNOSIS — O0990 Supervision of high risk pregnancy, unspecified, unspecified trimester: Secondary | ICD-10-CM | POA: Insufficient documentation

## 2017-11-11 DIAGNOSIS — Z113 Encounter for screening for infections with a predominantly sexual mode of transmission: Secondary | ICD-10-CM

## 2017-11-11 DIAGNOSIS — O26891 Other specified pregnancy related conditions, first trimester: Secondary | ICD-10-CM | POA: Diagnosis not present

## 2017-11-11 DIAGNOSIS — Z3480 Encounter for supervision of other normal pregnancy, unspecified trimester: Secondary | ICD-10-CM

## 2017-11-11 DIAGNOSIS — R399 Unspecified symptoms and signs involving the genitourinary system: Secondary | ICD-10-CM

## 2017-11-11 MED ORDER — NITROFURANTOIN MONOHYD MACRO 100 MG PO CAPS: 100.0000 mg | ORAL_CAPSULE | Freq: Two times a day (BID) | ORAL | 1 refills | Status: DC

## 2017-11-11 NOTE — Progress Notes (Signed)
Declined BabyScripts  DATING AND VIABILITY SONOGRAM   Carrie Cameron is a 32 y.o. year old [phone] with LMP Patient's last menstrual period was 09/05/2017. which would correlate to  [redacted]w[redacted]d weeks gestation.  She has regular menstrual cycles.   She is here today for a confirmatory initial sonogram.    GESTATION: SINGLETON     FETAL ACTIVITY:          Heart rate: 180bpm          The fetus is active.    GESTATIONAL AGE AND  BIOMETRICS:  Gestational criteria: Estimated Date of Delivery: 06/12/18 by LMP now at [redacted]w[redacted]d  Previous Scans:0  CROWN RUMP LENGTH           2.66cm         9.4 weeks                                                   AVERAGE EGA(BY THIS SCAN):  9.4 weeks  WORKING EDD( LMP ):  06/12/2018     TECHNICIAN COMMENTS:  SLIUP measuring 9.4 wks c/w LMP and FHR 180bpm   A copy of this report including all images has been saved and backed up to a second source for retrieval if needed. All measures and details of the anatomical scan, placentation, fluid volume and pelvic anatomy are contained in that report.  Annia Belt 11/11/2017 8:39 AM

## 2017-11-11 NOTE — Patient Instructions (Signed)
First Trimester of Pregnancy The first trimester of pregnancy is from week 1 until the end of week 13 (months 1 through 3). A week after a sperm fertilizes an egg, the egg will implant on the wall of the uterus. This embryo will begin to develop into a baby. Genes from you and your partner will form the baby. The female genes will determine whether the baby will be a boy or a girl. At 6-8 weeks, the eyes and face will be formed, and the heartbeat can be seen on ultrasound. At the end of 12 weeks, all the baby's organs will be formed. Now that you are pregnant, you will want to do everything you can to have a healthy baby. Two of the most important things are to get good prenatal care and to follow your health care provider's instructions. Prenatal care is all the medical care you receive before the baby's birth. This care will help prevent, find, and treat any problems during the pregnancy and childbirth. Body changes during your first trimester Your body goes through many changes during pregnancy. The changes vary from woman to woman.  You may gain or lose a couple of pounds at first.  You may feel sick to your stomach (nauseous) and you may throw up (vomit). If the vomiting is uncontrollable, call your health care provider.  You may tire easily.  You may develop headaches that can be relieved by medicines. All medicines should be approved by your health care provider.  You may urinate more often. Painful urination may mean you have a bladder infection.  You may develop heartburn as a result of your pregnancy.  You may develop constipation because certain hormones are causing the muscles that push stool through your intestines to slow down.  You may develop hemorrhoids or swollen veins (varicose veins).  Your breasts may begin to grow larger and become tender. Your nipples may stick out more, and the tissue that surrounds them (areola) may become darker.  Your gums may bleed and may be  sensitive to brushing and flossing.  Dark spots or blotches (chloasma, mask of pregnancy) may develop on your face. This will likely fade after the baby is born.  Your menstrual periods will stop.  You may have a loss of appetite.  You may develop cravings for certain kinds of food.  You may have changes in your emotions from day to day, such as being excited to be pregnant or being concerned that something may go wrong with the pregnancy and baby.  You may have more vivid and strange dreams.  You may have changes in your hair. These can include thickening of your hair, rapid growth, and changes in texture. Some women also have hair loss during or after pregnancy, or hair that feels dry or thin. Your hair will most likely return to normal after your baby is born.  What to expect at prenatal visits During a routine prenatal visit:  You will be weighed to make sure you and the baby are growing normally.  Your blood pressure will be taken.  Your abdomen will be measured to track your baby's growth.  The fetal heartbeat will be listened to between weeks 10 and 14 of your pregnancy.  Test results from any previous visits will be discussed.  Your health care provider may ask you:  How you are feeling.  If you are feeling the baby move.  If you have had any abnormal symptoms, such as leaking fluid, bleeding, severe headaches,   or abdominal cramping.  If you are using any tobacco products, including cigarettes, chewing tobacco, and electronic cigarettes.  If you have any questions.  Other tests that may be performed during your first trimester include:  Blood tests to find your blood type and to check for the presence of any previous infections. The tests will also be used to check for low iron levels (anemia) and protein on red blood cells (Rh antibodies). Depending on your risk factors, or if you previously had diabetes during pregnancy, you may have tests to check for high blood  sugar that affects pregnant women (gestational diabetes).  Urine tests to check for infections, diabetes, or protein in the urine.  An ultrasound to confirm the proper growth and development of the baby.  Fetal screens for spinal cord problems (spina bifida) and Down syndrome.  HIV (human immunodeficiency virus) testing. Routine prenatal testing includes screening for HIV, unless you choose not to have this test.  You may need other tests to make sure you and the baby are doing well.  Follow these instructions at home: Medicines  Follow your health care provider's instructions regarding medicine use. Specific medicines may be either safe or unsafe to take during pregnancy.  Take a prenatal vitamin that contains at least 600 micrograms (mcg) of folic acid.  If you develop constipation, try taking a stool softener if your health care provider approves. Eating and drinking  Eat a balanced diet that includes fresh fruits and vegetables, whole grains, good sources of protein such as meat, eggs, or tofu, and low-fat dairy. Your health care provider will help you determine the amount of weight gain that is right for you.  Avoid raw meat and uncooked cheese. These carry germs that can cause birth defects in the baby.  Eating four or five small meals rather than three large meals a day may help relieve nausea and vomiting. If you start to feel nauseous, eating a few soda crackers can be helpful. Drinking liquids between meals, instead of during meals, also seems to help ease nausea and vomiting.  Limit foods that are high in fat and processed sugars, such as fried and sweet foods.  To prevent constipation: ? Eat foods that are high in fiber, such as fresh fruits and vegetables, whole grains, and beans. ? Drink enough fluid to keep your urine clear or pale yellow. Activity  Exercise only as directed by your health care provider. Most women can continue their usual exercise routine during  pregnancy. Try to exercise for 30 minutes at least 5 days a week. Exercising will help you: ? Control your weight. ? Stay in shape. ? Be prepared for labor and delivery.  Experiencing pain or cramping in the lower abdomen or lower back is a good sign that you should stop exercising. Check with your health care provider before continuing with normal exercises.  Try to avoid standing for long periods of time. Move your legs often if you must stand in one place for a long time.  Avoid heavy lifting.  Wear low-heeled shoes and practice good posture.  You may continue to have sex unless your health care provider tells you not to. Relieving pain and discomfort  Wear a good support bra to relieve breast tenderness.  Take warm sitz baths to soothe any pain or discomfort caused by hemorrhoids. Use hemorrhoid cream if your health care provider approves.  Rest with your legs elevated if you have leg cramps or low back pain.  If you develop   varicose veins in your legs, wear support hose. Elevate your feet for 15 minutes, 3-4 times a day. Limit salt in your diet. Prenatal care  Schedule your prenatal visits by the twelfth week of pregnancy. They are usually scheduled monthly at first, then more often in the last 2 months before delivery.  Write down your questions. Take them to your prenatal visits.  Keep all your prenatal visits as told by your health care provider. This is important. Safety  Wear your seat belt at all times when driving.  Make a list of emergency phone numbers, including numbers for family, friends, the hospital, and police and fire departments. General instructions  Ask your health care provider for a referral to a local prenatal education class. Begin classes no later than the beginning of month 6 of your pregnancy.  Ask for help if you have counseling or nutritional needs during pregnancy. Your health care provider can offer advice or refer you to specialists for help  with various needs.  Do not use hot tubs, steam rooms, or saunas.  Do not douche or use tampons or scented sanitary pads.  Do not cross your legs for long periods of time.  Avoid cat litter boxes and soil used by cats. These carry germs that can cause birth defects in the baby and possibly loss of the fetus by miscarriage or stillbirth.  Avoid all smoking, herbs, alcohol, and medicines not prescribed by your health care provider. Chemicals in these products affect the formation and growth of the baby.  Do not use any products that contain nicotine or tobacco, such as cigarettes and e-cigarettes. If you need help quitting, ask your health care provider. You may receive counseling support and other resources to help you quit.  Schedule a dentist appointment. At home, brush your teeth with a soft toothbrush and be gentle when you floss. Contact a health care provider if:  You have dizziness.  You have mild pelvic cramps, pelvic pressure, or nagging pain in the abdominal area.  You have persistent nausea, vomiting, or diarrhea.  You have a bad smelling vaginal discharge.  You have pain when you urinate.  You notice increased swelling in your face, hands, legs, or ankles.  You are exposed to fifth disease or chickenpox.  You are exposed to German measles (rubella) and have never had it. Get help right away if:  You have a fever.  You are leaking fluid from your vagina.  You have spotting or bleeding from your vagina.  You have severe abdominal cramping or pain.  You have rapid weight gain or loss.  You vomit blood or material that looks like coffee grounds.  You develop a severe headache.  You have shortness of breath.  You have any kind of trauma, such as from a fall or a car accident. Summary  The first trimester of pregnancy is from week 1 until the end of week 13 (months 1 through 3).  Your body goes through many changes during pregnancy. The changes vary from  woman to woman.  You will have routine prenatal visits. During those visits, your health care provider will examine you, discuss any test results you may have, and talk with you about how you are feeling. This information is not intended to replace advice given to you by your health care provider. Make sure you discuss any questions you have with your health care provider. Document Released: 10/01/2001 Document Revised: 09/18/2016 Document Reviewed: 09/18/2016 Elsevier Interactive Patient Education  2018 Elsevier   Inc.  

## 2017-11-11 NOTE — Progress Notes (Signed)
Subjective:   Carrie Cameron is a 32 y.o. [id number] at [redacted]w[redacted]d by LMP, early ultrasound being seen today for her first obstetrical visit.  Her obstetrical history is significant for recent SAB. Patient does intend to breast feed. Pregnancy history fully reviewed.  Patient reports increased urinary frequency, dysuria and suprapubic pain. No fevers, chills, N/V, back pain. Feels she has a UTI. Also increased abnormal vaginal discharge.  HISTORY: Obstetric History   G10  P5   T5   P0   A4   L5    SAB4   TAB0   Ectopic0   Multiple0   Live Births5     # Outcome Date GA Lbr Len/2nd Weight Sex Delivery Anes PTL Lv  10 Current           9 SAB 07/21/16     SAB     8 Term 09/03/12 [redacted]w[redacted]d  7 lb (3.175 kg) M Vag-Spont   LIV  7 SAB 2013 [redacted]w[redacted]d         6 Term 07/31/08 [redacted]w[redacted]d  10 lb (4.536 kg) F    LIV  5 Term 01/27/07 [redacted]w[redacted]d  8 lb (3.629 kg) F    LIV  4 SAB 2007 [redacted]w[redacted]d         3 Term 06/03/04 [redacted]w[redacted]d  8 lb (3.629 kg) M Vag-Spont   LIV  2 Term 11/25/02 [redacted]w[redacted]d  8 lb (3.629 kg) M Vag-Spont   LIV     Complications: Postpartum hemorrhage  1 SAB      SAB        Past Medical History:  Diagnosis Date  . Medical history non-contributory    Past Surgical History:  Procedure Laterality Date  . NO PAST SURGERIES     Family History  Problem Relation Age of Onset  . Diabetes Maternal Uncle    Social History   Tobacco Use  . Smoking status: Never Smoker  . Smokeless tobacco: Never Used  Substance Use Topics  . Alcohol use: No  . Drug use: No   No Known Allergies Current Outpatient Medications on File Prior to Visit  Medication Sig Dispense Refill  . acetaminophen (TYLENOL) 500 MG tablet Take 500 mg by mouth every 6 (six) hours as needed.    . ferrous sulfate 325 (65 FE) MG EC tablet Take 325 mg by mouth 3 (three) times daily with meals.    . Prenatal Vit-Fe Fumarate-FA (PRENATAL MULTIVITAMIN) TABS tablet Take 1 tablet by mouth daily at 12 noon.     No current facility-administered  medications on file prior to visit.      Exam   Vitals:   11/11/17 0821  BP: 113/74  Pulse: 69  Weight: 149 lb (67.6 kg)   Bedside scan showed viable IUP at [redacted]w[redacted]d.     Uterus:     Pelvic Exam: Perineum: no hemorrhoids, normal perineum   Vulva: normal external genitalia, no lesions   Vagina:  normal mucosa, white discharge   Cervix: no lesions and normal    Adnexa: normal adnexa and no mass, fullness, tenderness   Bony Pelvis: average  System: General: well-developed, well-nourished female in no acute distress   Breast:  normal appearance, no masses or tenderness   Skin: normal coloration and turgor, no rashes   Neurologic: oriented, normal, negative, normal mood   Extremities: normal strength, tone, and muscle mass, ROM of all joints is normal   HEENT PERRLA, extraocular movement intact and sclera clear, anicteric   Mouth/Teeth mucous  membranes moist, pharynx normal without lesions and dental hygiene good   Neck supple and no masses   Cardiovascular: regular rate and rhythm   Respiratory:  no respiratory distress, normal breath sounds   Abdomen: soft, +suprapubic tenderness; bowel sounds normal; no masses,  no organomegaly   Urine dipstick: + blood , +LE, +nitrates  Assessment:   Pregnancy: [id number] Patient Active Problem List   Diagnosis Date Noted  . Supervision of other normal pregnancy, antepartum 11/11/2017  . Overweight (BMI 25.0-29.9) 04/05/2017     Plan:  1. UTI symptoms Macrobid prescribed, will follow up culture.  - nitrofurantoin, macrocrystal-monohydrate, (MACROBID) 100 MG capsule; Take 1 capsule (100 mg total) by mouth 2 (two) times daily.  Dispense: 14 capsule; Refill: 1  2. Vaginal discharge during pregnancy in first trimester - Cervicovaginal ancillary done, will follow up results and manage accordingly.  3. Supervision of other normal pregnancy, antepartum Labs ordered - Obstetric Panel, Including HIV - Culture, OB Urine - Genetic Screening -  ferrous sulfate 325 (65 FE) MG EC tablet; Take 325 mg by mouth 3 (three) times daily with meals. - Cervicovaginal ancillary only - Enroll patient in Babyscripts Program  Initial labs drawn. Continue prenatal vitamins. Genetic Screening discussed, NIPS: ordered. Ultrasound discussed; fetal anatomic survey: to be ordered later. Problem list reviewed and updated. The nature of [facility] with multiple MDs and other Advanced Practice Providers was explained to patient; also emphasized that residents, students are part of our team. Routine obstetric precautions reviewed. Return in about 2 weeks (around 11/25/2017) for Lab appointment.  4 weeks - OB visit.     Verita Schneiders, MD, [place] for [employer], [place]

## 2017-11-12 LAB — CERVICOVAGINAL ANCILLARY ONLY
Bacterial vaginitis: NEGATIVE
Candida vaginitis: POSITIVE — AB
Chlamydia: NEGATIVE
Neisseria Gonorrhea: NEGATIVE
Trichomonas: NEGATIVE

## 2017-11-13 LAB — CULTURE, OB URINE

## 2017-11-13 LAB — URINE CULTURE, OB REFLEX

## 2017-11-14 ENCOUNTER — Other Ambulatory Visit: Payer: Self-pay | Admitting: Obstetrics & Gynecology

## 2017-11-14 DIAGNOSIS — B373 Candidiasis of vulva and vagina: Secondary | ICD-10-CM

## 2017-11-14 DIAGNOSIS — O98819 Other maternal infectious and parasitic diseases complicating pregnancy, unspecified trimester: Principal | ICD-10-CM

## 2017-11-14 DIAGNOSIS — B3731 Acute candidiasis of vulva and vagina: Secondary | ICD-10-CM

## 2017-11-14 MED ORDER — TERCONAZOLE 0.4 % VA CREA: 1.0000 | TOPICAL_CREAM | Freq: Every day | VAGINAL | 0 refills | Status: DC

## 2017-11-25 ENCOUNTER — Other Ambulatory Visit: Payer: BLUE CROSS/BLUE SHIELD

## 2017-11-25 DIAGNOSIS — Z3480 Encounter for supervision of other normal pregnancy, unspecified trimester: Secondary | ICD-10-CM | POA: Diagnosis not present

## 2017-11-25 DIAGNOSIS — Z31430 Encounter of female for testing for genetic disease carrier status for procreative management: Secondary | ICD-10-CM | POA: Diagnosis not present

## 2017-11-25 DIAGNOSIS — Z3481 Encounter for supervision of other normal pregnancy, first trimester: Secondary | ICD-10-CM | POA: Diagnosis not present

## 2017-11-26 LAB — OBSTETRIC PANEL, INCLUDING HIV
Antibody Screen: NEGATIVE
Basophils Absolute: 0 10*3/uL (ref 0.0–0.2)
Basos: 0 %
EOS (ABSOLUTE): 0 10*3/uL (ref 0.0–0.4)
Eos: 1 %
HIV Screen 4th Generation wRfx: NONREACTIVE
Hematocrit: 34.6 % (ref 34.0–46.6)
Hemoglobin: 11.1 g/dL (ref 11.1–15.9)
Hepatitis B Surface Ag: NEGATIVE
Immature Grans (Abs): 0 10*3/uL (ref 0.0–0.1)
Immature Granulocytes: 0 %
Lymphocytes Absolute: 1.9 10*3/uL (ref 0.7–3.1)
Lymphs: 31 %
MCH: 27.2 pg (ref 26.6–33.0)
MCHC: 32.1 g/dL (ref 31.5–35.7)
MCV: 85 fL (ref 79–97)
Monocytes Absolute: 0.4 10*3/uL (ref 0.1–0.9)
Monocytes: 7 %
Neutrophils Absolute: 3.8 10*3/uL (ref 1.4–7.0)
Neutrophils: 61 %
Platelets: 282 10*3/uL (ref 150–379)
RBC: 4.08 x10E6/uL (ref 3.77–5.28)
RDW: 16.8 % — ABNORMAL HIGH (ref 12.3–15.4)
RPR Ser Ql: NONREACTIVE
Rh Factor: POSITIVE
Rubella Antibodies, IGG: 1.59 index (ref >0.99)
WBC: 6.2 10*3/uL (ref 3.4–10.8)

## 2017-12-03 ENCOUNTER — Encounter: Payer: Self-pay | Admitting: *Deleted

## 2017-12-09 ENCOUNTER — Ambulatory Visit (INDEPENDENT_AMBULATORY_CARE_PROVIDER_SITE_OTHER): Payer: BLUE CROSS/BLUE SHIELD | Admitting: Obstetrics and Gynecology

## 2017-12-09 ENCOUNTER — Encounter: Payer: Self-pay | Admitting: Obstetrics and Gynecology

## 2017-12-09 VITALS — BP 121/80 | HR 86 | Wt 148.7 lb

## 2017-12-09 DIAGNOSIS — Z3480 Encounter for supervision of other normal pregnancy, unspecified trimester: Secondary | ICD-10-CM

## 2017-12-09 DIAGNOSIS — Z3482 Encounter for supervision of other normal pregnancy, second trimester: Secondary | ICD-10-CM

## 2017-12-09 NOTE — Progress Notes (Signed)
Does not want to do baby scripts anymore.

## 2017-12-09 NOTE — Progress Notes (Signed)
Prenatal Visit Note Date: 12/09/2017 Clinic: [facility]  Subjective:  Carrie Cameron is a 32 y.o. [id number] at [redacted]w[redacted]d being seen today for ongoing prenatal care.  She is currently monitored for the following issues for this low-risk pregnancy and has Overweight (BMI 25.0-29.9) and Supervision of other normal pregnancy, antepartum on their problem list.  Patient reports no complaints.   Contractions: Not present. Vag. Bleeding: None.  Movement: Present. Denies leaking of fluid.   The following portions of the patient's history were reviewed and updated as appropriate: allergies, current medications, past family history, past medical history, past social history, past surgical history and problem list. Problem list updated.  Objective:   Vitals:   12/09/17 0811  BP: 121/80  Pulse: 86  Weight: 148 lb 11.2 oz (67.4 kg)    Fetal Status: Fetal Heart Rate (bpm): 137   Movement: Present     General:  Alert, oriented and cooperative. Patient is in no acute distress.  Skin: Skin is warm and dry. No rash noted.   Cardiovascular: Normal heart rate noted  Respiratory: Normal respiratory effort, no problems with respiration noted  Abdomen: Soft, gravid, appropriate for gestational age. Pain/Pressure: Absent     Pelvic:  Cervical exam deferred        Extremities: Normal range of motion.  Edema: None  Mental Status: Normal mood and affect. Normal behavior. Normal judgment and thought content.   Urinalysis:      Assessment and Plan:  Pregnancy: [id number] at [redacted]w[redacted]d  1. Supervision of other normal pregnancy, antepartum Routine care. Off of babyscripts. Doesn't want anything for gerd. Mild nausea w/o vomiting.  - [place] MFM OB COMP + 14 WK; Future  Preterm labor symptoms and general obstetric precautions including but not limited to vaginal bleeding, contractions, leaking of fluid and fetal movement were reviewed in detail with the patient. Please refer to After Visit  Summary for other counseling recommendations.  Return in about 3 weeks (around 12/30/2017) for 3-4wk rob.   Aletha Halim, MD

## 2017-12-09 NOTE — Progress Notes (Signed)
Pt needs to activate BRX BP cuff - Pt spoke to [place] on Friday 2/15

## 2017-12-18 ENCOUNTER — Ambulatory Visit (INDEPENDENT_AMBULATORY_CARE_PROVIDER_SITE_OTHER): Payer: BLUE CROSS/BLUE SHIELD | Admitting: Obstetrics and Gynecology

## 2017-12-18 VITALS — BP 116/74 | HR 74 | Wt 144.0 lb

## 2017-12-18 DIAGNOSIS — Z8742 Personal history of other diseases of the female genital tract: Secondary | ICD-10-CM

## 2017-12-18 DIAGNOSIS — Z3492 Encounter for supervision of normal pregnancy, unspecified, second trimester: Secondary | ICD-10-CM

## 2017-12-18 NOTE — Progress Notes (Signed)
Prenatal Visit Note-Work In Visit Date: 12/18/2017 Clinic: [facility]  CC: Vaginal spotting on friday  Subjective:  Carrie Cameron is a 32 y.o. [id number] at [redacted]w[redacted]d being seen today for ongoing prenatal care.  She is currently monitored for the following issues for this low-risk pregnancy and has Overweight (BMI 25.0-29.9) and Supervision of other normal pregnancy, antepartum on their problem list.  Patient states she had some spotting with wiping (BRB and then old brown d/c) on Friday and cramping but none since. Last sex about a week prior.  Contractions: Not present.  .  Movement: Present. Denies leaking of fluid.   The following portions of the patient's history were reviewed and updated as appropriate: allergies, current medications, past family history, past medical history, past social history, past surgical history and problem list. Problem list updated.  Objective:   Vitals:   12/18/17 0958  BP: 116/74  Pulse: 74  Weight: 144 lb (65.3 kg)    Fetal Status: Fetal Heart Rate (bpm): 145   Movement: Present     General:  Alert, oriented and cooperative. Patient is in no acute distress.  Skin: Skin is warm and dry. No rash noted.   Cardiovascular: Normal heart rate noted  Respiratory: Normal respiratory effort, no problems with respiration noted  Abdomen: Soft, gravid, appropriate for gestational age. Pain/Pressure: Absent     Pelvic:  Cervical exam performed        EGBUS normal Vaginal vault with normal minima white d/c in vault (no smell), no VB, Cx: visually closed and felt cl/long/high/nttp  Extremities: Normal range of motion.  Edema: None  Mental Status: Normal mood and affect. Normal behavior. Normal judgment and thought content.   Urinalysis:      Assessment and Plan:  Pregnancy: [id number] at [redacted]w[redacted]d  1. History of vaginal bleeding Pt told to keep eye on it but no abnormal exam findings today  Preterm labor symptoms and general obstetric  precautions including but not limited to vaginal bleeding, contractions, leaking of fluid and fetal movement were reviewed in detail with the patient. Please refer to After Visit Summary for other counseling recommendations.  Return for already scheduled for f/u.   Aletha Halim, MD

## 2017-12-18 NOTE — Progress Notes (Signed)
Follow-up on bleeding.

## 2018-01-06 ENCOUNTER — Ambulatory Visit (INDEPENDENT_AMBULATORY_CARE_PROVIDER_SITE_OTHER): Payer: BLUE CROSS/BLUE SHIELD | Admitting: Family Medicine

## 2018-01-06 VITALS — BP 101/69 | HR 74 | Wt 151.4 lb

## 2018-01-06 DIAGNOSIS — Z3480 Encounter for supervision of other normal pregnancy, unspecified trimester: Secondary | ICD-10-CM

## 2018-01-06 DIAGNOSIS — Z3482 Encounter for supervision of other normal pregnancy, second trimester: Secondary | ICD-10-CM

## 2018-01-06 NOTE — Patient Instructions (Signed)
 Second Trimester of Pregnancy The second trimester is from week 14 through week 27 (months 4 through 6). The second trimester is often a time when you feel your best. Your body has adjusted to being pregnant, and you begin to feel better physically. Usually, morning sickness has lessened or quit completely, you may have more energy, and you may have an increase in appetite. The second trimester is also a time when the fetus is growing rapidly. At the end of the sixth month, the fetus is about 9 inches long and weighs about 1 pounds. You will likely begin to feel the baby move (quickening) between 16 and 20 weeks of pregnancy. Body changes during your second trimester Your body continues to go through many changes during your second trimester. The changes vary from woman to woman.  Your weight will continue to increase. You will notice your lower abdomen bulging out.  You may begin to get stretch marks on your hips, abdomen, and breasts.  You may develop headaches that can be relieved by medicines. The medicines should be approved by your health care provider.  You may urinate more often because the fetus is pressing on your bladder.  You may develop or continue to have heartburn as a result of your pregnancy.  You may develop constipation because certain hormones are causing the muscles that push waste through your intestines to slow down.  You may develop hemorrhoids or swollen, bulging veins (varicose veins).  You may have back pain. This is caused by: ? Weight gain. ? Pregnancy hormones that are relaxing the joints in your pelvis. ? A shift in weight and the muscles that support your balance.  Your breasts will continue to grow and they will continue to become tender.  Your gums may bleed and may be sensitive to brushing and flossing.  Dark spots or blotches (chloasma, mask of pregnancy) may develop on your face. This will likely fade after the baby is born.  A dark line from  your belly button to the pubic area (linea nigra) may appear. This will likely fade after the baby is born.  You may have changes in your hair. These can include thickening of your hair, rapid growth, and changes in texture. Some women also have hair loss during or after pregnancy, or hair that feels dry or thin. Your hair will most likely return to normal after your baby is born.  What to expect at prenatal visits During a routine prenatal visit:  You will be weighed to make sure you and the fetus are growing normally.  Your blood pressure will be taken.  Your abdomen will be measured to track your baby's growth.  The fetal heartbeat will be listened to.  Any test results from the previous visit will be discussed.  Your health care provider may ask you:  How you are feeling.  If you are feeling the baby move.  If you have had any abnormal symptoms, such as leaking fluid, bleeding, severe headaches, or abdominal cramping.  If you are using any tobacco products, including cigarettes, chewing tobacco, and electronic cigarettes.  If you have any questions.  Other tests that may be performed during your second trimester include:  Blood tests that check for: ? Low iron levels (anemia). ? High blood sugar that affects pregnant women (gestational diabetes) between 24 and 28 weeks. ? Rh antibodies. This is to check for a protein on red blood cells (Rh factor).  Urine tests to check for infections, diabetes,   or protein in the urine.  An ultrasound to confirm the proper growth and development of the baby.  An amniocentesis to check for possible genetic problems.  Fetal screens for spina bifida and Down syndrome.  HIV (human immunodeficiency virus) testing. Routine prenatal testing includes screening for HIV, unless you choose not to have this test.  Follow these instructions at home: Medicines  Follow your health care provider's instructions regarding medicine use. Specific  medicines may be either safe or unsafe to take during pregnancy.  Take a prenatal vitamin that contains at least 600 micrograms (mcg) of folic acid.  If you develop constipation, try taking a stool softener if your health care provider approves. Eating and drinking  Eat a balanced diet that includes fresh fruits and vegetables, whole grains, good sources of protein such as meat, eggs, or tofu, and low-fat dairy. Your health care provider will help you determine the amount of weight gain that is right for you.  Avoid raw meat and uncooked cheese. These carry germs that can cause birth defects in the baby.  If you have low calcium intake from food, talk to your health care provider about whether you should take a daily calcium supplement.  Limit foods that are high in fat and processed sugars, such as fried and sweet foods.  To prevent constipation: ? Drink enough fluid to keep your urine clear or pale yellow. ? Eat foods that are high in fiber, such as fresh fruits and vegetables, whole grains, and beans. Activity  Exercise only as directed by your health care provider. Most women can continue their usual exercise routine during pregnancy. Try to exercise for 30 minutes at least 5 days a week. Stop exercising if you experience uterine contractions.  Avoid heavy lifting, wear low heel shoes, and practice good posture.  A sexual relationship may be continued unless your health care provider directs you otherwise. Relieving pain and discomfort  Wear a good support bra to prevent discomfort from breast tenderness.  Take warm sitz baths to soothe any pain or discomfort caused by hemorrhoids. Use hemorrhoid cream if your health care provider approves.  Rest with your legs elevated if you have leg cramps or low back pain.  If you develop varicose veins, wear support hose. Elevate your feet for 15 minutes, 3-4 times a day. Limit salt in your diet. Prenatal Care  Write down your questions.  Take them to your prenatal visits.  Keep all your prenatal visits as told by your health care provider. This is important. Safety  Wear your seat belt at all times when driving.  Make a list of emergency phone numbers, including numbers for family, friends, the hospital, and police and fire departments. General instructions  Ask your health care provider for a referral to a local prenatal education class. Begin classes no later than the beginning of month 6 of your pregnancy.  Ask for help if you have counseling or nutritional needs during pregnancy. Your health care provider can offer advice or refer you to specialists for help with various needs.  Do not use hot tubs, steam rooms, or saunas.  Do not douche or use tampons or scented sanitary pads.  Do not cross your legs for long periods of time.  Avoid cat litter boxes and soil used by cats. These carry germs that can cause birth defects in the baby and possibly loss of the fetus by miscarriage or stillbirth.  Avoid all smoking, herbs, alcohol, and unprescribed drugs. Chemicals in these products   can affect the formation and growth of the baby.  Do not use any products that contain nicotine or tobacco, such as cigarettes and e-cigarettes. If you need help quitting, ask your health care provider.  Visit your dentist if you have not gone yet during your pregnancy. Use a soft toothbrush to brush your teeth and be gentle when you floss. Contact a health care provider if:  You have dizziness.  You have mild pelvic cramps, pelvic pressure, or nagging pain in the abdominal area.  You have persistent nausea, vomiting, or diarrhea.  You have a bad smelling vaginal discharge.  You have pain when you urinate. Get help right away if:  You have a fever.  You are leaking fluid from your vagina.  You have spotting or bleeding from your vagina.  You have severe abdominal cramping or pain.  You have rapid weight gain or weight  loss.  You have shortness of breath with chest pain.  You notice sudden or extreme swelling of your face, hands, ankles, feet, or legs.  You have not felt your baby move in over an hour.  You have severe headaches that do not go away when you take medicine.  You have vision changes. Summary  The second trimester is from week 14 through week 27 (months 4 through 6). It is also a time when the fetus is growing rapidly.  Your body goes through many changes during pregnancy. The changes vary from woman to woman.  Avoid all smoking, herbs, alcohol, and unprescribed drugs. These chemicals affect the formation and growth your baby.  Do not use any tobacco products, such as cigarettes, chewing tobacco, and e-cigarettes. If you need help quitting, ask your health care provider.  Contact your health care provider if you have any questions. Keep all prenatal visits as told by your health care provider. This is important. This information is not intended to replace advice given to you by your health care provider. Make sure you discuss any questions you have with your health care provider. Document Released: 10/01/2001 Document Revised: 11/12/2016 Document Reviewed: 11/12/2016 Elsevier Interactive Patient Education  2018 [employer].   Breastfeeding Choosing to breastfeed is one of the best decisions you can make for yourself and your baby. A change in hormones during pregnancy causes your breasts to make breast milk in your milk-producing glands. Hormones prevent breast milk from being released before your baby is born. They also prompt milk flow after birth. Once breastfeeding has begun, thoughts of your baby, as well as his or her sucking or crying, can stimulate the release of milk from your milk-producing glands. Benefits of breastfeeding Research shows that breastfeeding offers many health benefits for infants and mothers. It also offers a cost-free and convenient way to feed your  baby. For your baby  Your first milk (colostrum) helps your baby's digestive system to function better.  Special cells in your milk (antibodies) help your baby to fight off infections.  Breastfed babies are less likely to develop asthma, allergies, obesity, or type 2 diabetes. They are also at lower risk for sudden infant death syndrome (SIDS).  Nutrients in breast milk are better able to meet your baby's needs compared to infant formula.  Breast milk improves your baby's brain development. For you  Breastfeeding helps to create a very special bond between you and your baby.  Breastfeeding is convenient. Breast milk costs nothing and is always available at the correct temperature.  Breastfeeding helps to burn calories. It helps you  to lose the weight that you gained during pregnancy.  Breastfeeding makes your uterus return faster to its size before pregnancy. It also slows bleeding (lochia) after you give birth.  Breastfeeding helps to lower your risk of developing type 2 diabetes, osteoporosis, rheumatoid arthritis, cardiovascular disease, and breast, ovarian, uterine, and endometrial cancer later in life. Breastfeeding basics Starting breastfeeding  Find a comfortable place to sit or lie down, with your neck and back well-supported.  Place a pillow or a rolled-up blanket under your baby to bring him or her to the level of your breast (if you are seated). Nursing pillows are specially designed to help support your arms and your baby while you breastfeed.  Make sure that your baby's tummy (abdomen) is facing your abdomen.  Gently massage your breast. With your fingertips, massage from the outer edges of your breast inward toward the nipple. This encourages milk flow. If your milk flows slowly, you may need to continue this action during the feeding.  Support your breast with 4 fingers underneath and your thumb above your nipple (make the letter "C" with your hand). Make sure your  fingers are well away from your nipple and your baby's mouth.  Stroke your baby's lips gently with your finger or nipple.  When your baby's mouth is open wide enough, quickly bring your baby to your breast, placing your entire nipple and as much of the areola as possible into your baby's mouth. The areola is the colored area around your nipple. ? More areola should be visible above your baby's upper lip than below the lower lip. ? Your baby's lips should be opened and extended outward (flanged) to ensure an adequate, comfortable latch. ? Your baby's tongue should be between his or her lower gum and your breast.  Make sure that your baby's mouth is correctly positioned around your nipple (latched). Your baby's lips should create a seal on your breast and be turned out (everted).  It is common for your baby to suck about 2-3 minutes in order to start the flow of breast milk. Latching Teaching your baby how to latch onto your breast properly is very important. An improper latch can cause nipple pain, decreased milk supply, and poor weight gain in your baby. Also, if your baby is not latched onto your nipple properly, he or she may swallow some air during feeding. This can make your baby fussy. Burping your baby when you switch breasts during the feeding can help to get rid of the air. However, teaching your baby to latch on properly is still the best way to prevent fussiness from swallowing air while breastfeeding. Signs that your baby has successfully latched onto your nipple  Silent tugging or silent sucking, without causing you pain. Infant's lips should be extended outward (flanged).  Swallowing heard between every 3-4 sucks once your milk has started to flow (after your let-down milk reflex occurs).  Muscle movement above and in front of his or her ears while sucking.  Signs that your baby has not successfully latched onto your nipple  Sucking sounds or smacking sounds from your baby while  breastfeeding.  Nipple pain.  If you think your baby has not latched on correctly, slip your finger into the corner of your baby's mouth to break the suction and place it between your baby's gums. Attempt to start breastfeeding again. Signs of successful breastfeeding Signs from your baby  Your baby will gradually decrease the number of sucks or will completely stop sucking.  Your baby will fall asleep.  Your baby's body will relax.  Your baby will retain a small amount of milk in his or her mouth.  Your baby will let go of your breast by himself or herself.  Signs from you  Breasts that have increased in firmness, weight, and size 1-3 hours after feeding.  Breasts that are softer immediately after breastfeeding.  Increased milk volume, as well as a change in milk consistency and color by the fifth day of breastfeeding.  Nipples that are not sore, cracked, or bleeding.  Signs that your baby is getting enough milk  Wetting at least 1-2 diapers during the first 24 hours after birth.  Wetting at least 5-6 diapers every 24 hours for the first week after birth. The urine should be clear or pale yellow by the age of 5 days.  Wetting 6-8 diapers every 24 hours as your baby continues to grow and develop.  At least 3 stools in a 24-hour period by the age of 5 days. The stool should be soft and yellow.  At least 3 stools in a 24-hour period by the age of 7 days. The stool should be seedy and yellow.  No loss of weight greater than 10% of birth weight during the first 3 days of life.  Average weight gain of 4-7 oz (113-198 g) per week after the age of 4 days.  Consistent daily weight gain by the age of 5 days, without weight loss after the age of 2 weeks. After a feeding, your baby may spit up a small amount of milk. This is normal. Breastfeeding frequency and duration Frequent feeding will help you make more milk and can prevent sore nipples and extremely full breasts (breast  engorgement). Breastfeed when you feel the need to reduce the fullness of your breasts or when your baby shows signs of hunger. This is called "breastfeeding on demand." Signs that your baby is hungry include:  Increased alertness, activity, or restlessness.  Movement of the head from side to side.  Opening of the mouth when the corner of the mouth or cheek is stroked (rooting).  Increased sucking sounds, smacking lips, cooing, sighing, or squeaking.  Hand-to-mouth movements and sucking on fingers or hands.  Fussing or crying.  Avoid introducing a pacifier to your baby in the first 4-6 weeks after your baby is born. After this time, you may choose to use a pacifier. Research has shown that pacifier use during the first year of a baby's life decreases the risk of sudden infant death syndrome (SIDS). Allow your baby to feed on each breast as long as he or she wants. When your baby unlatches or falls asleep while feeding from the first breast, offer the second breast. Because newborns are often sleepy in the first few weeks of life, you may need to awaken your baby to get him or her to feed. Breastfeeding times will vary from baby to baby. However, the following rules can serve as a guide to help you make sure that your baby is properly fed:  Newborns (babies 75 weeks of age or younger) may breastfeed every 1-3 hours.  Newborns should not go without breastfeeding for longer than 3 hours during the day or 5 hours during the night.  You should breastfeed your baby a minimum of 8 times in a 24-hour period.  Breast milk pumping Pumping and storing breast milk allows you to make sure that your baby is exclusively fed your breast milk, even at times  when you are unable to breastfeed. This is especially important if you go back to work while you are still breastfeeding, or if you are not able to be present during feedings. Your lactation consultant can help you find a method of pumping that works best  for you and give you guidelines about how long it is safe to store breast milk. Caring for your breasts while you breastfeed Nipples can become dry, cracked, and sore while breastfeeding. The following recommendations can help keep your breasts moisturized and healthy:  Avoid using soap on your nipples.  Wear a supportive bra designed especially for nursing. Avoid wearing underwire-style bras or extremely tight bras (sports bras).  Air-dry your nipples for 3-4 minutes after each feeding.  Use only cotton bra pads to absorb leaked breast milk. Leaking of breast milk between feedings is normal.  Use lanolin on your nipples after breastfeeding. Lanolin helps to maintain your skin's normal moisture barrier. Pure lanolin is not harmful (not toxic) to your baby. You may also hand express a few drops of breast milk and gently massage that milk into your nipples and allow the milk to air-dry.  In the first few weeks after giving birth, some women experience breast engorgement. Engorgement can make your breasts feel heavy, warm, and tender to the touch. Engorgement peaks within 3-5 days after you give birth. The following recommendations can help to ease engorgement:  Completely empty your breasts while breastfeeding or pumping. You may want to start by applying warm, moist heat (in the shower or with warm, water-soaked hand towels) just before feeding or pumping. This increases circulation and helps the milk flow. If your baby does not completely empty your breasts while breastfeeding, pump any extra milk after he or she is finished.  Apply ice packs to your breasts immediately after breastfeeding or pumping, unless this is too uncomfortable for you. To do this: ? Put ice in a plastic bag. ? Place a towel between your skin and the bag. ? Leave the ice on for 20 minutes, 2-3 times a day.  Make sure that your baby is latched on and positioned properly while breastfeeding.  If engorgement persists  after 48 hours of following these recommendations, contact your health care provider or a Science writer. Overall health care recommendations while breastfeeding  Eat 3 healthy meals and 3 snacks every day. Well-nourished mothers who are breastfeeding need an additional 450-500 calories a day. You can meet this requirement by increasing the amount of a balanced diet that you eat.  Drink enough water to keep your urine pale yellow or clear.  Rest often, relax, and continue to take your prenatal vitamins to prevent fatigue, stress, and low vitamin and mineral levels in your body (nutrient deficiencies).  Do not use any products that contain nicotine or tobacco, such as cigarettes and e-cigarettes. Your baby may be harmed by chemicals from cigarettes that pass into breast milk and exposure to secondhand smoke. If you need help quitting, ask your health care provider.  Avoid alcohol.  Do not use illegal drugs or marijuana.  Talk with your health care provider before taking any medicines. These include over-the-counter and prescription medicines as well as vitamins and herbal supplements. Some medicines that may be harmful to your baby can pass through breast milk.  It is possible to become pregnant while breastfeeding. If birth control is desired, ask your health care provider about options that will be safe while breastfeeding your baby. Where to find more information: [employer]: www.llli.org Contact a health care provider if:  You feel like you want to stop breastfeeding or have become frustrated with breastfeeding.  Your nipples are cracked or bleeding.  Your breasts are red, tender, or warm.  You have: ? Painful breasts or nipples. ? A swollen area on either breast. ? A fever or chills. ? Nausea or vomiting. ? Drainage other than breast milk from your nipples.  Your breasts do not become full before feedings by the fifth day after you give birth.  You  feel sad and depressed.  Your baby is: ? Too sleepy to eat well. ? Having trouble sleeping. ? More than 37 week old and wetting fewer than 6 diapers in a 24-hour period. ? Not gaining weight by 70 days of age.  Your baby has fewer than 3 stools in a 24-hour period.  Your baby's skin or the white parts of his or her eyes become yellow. Get help right away if:  Your baby is overly tired (lethargic) and does not want to wake up and feed.  Your baby develops an unexplained fever. Summary  Breastfeeding offers many health benefits for infant and mothers.  Try to breastfeed your infant when he or she shows early signs of hunger.  Gently tickle or stroke your baby's lips with your finger or nipple to allow the baby to open his or her mouth. Bring the baby to your breast. Make sure that much of the areola is in your baby's mouth. Offer one side and burp the baby before you offer the other side.  Talk with your health care provider or lactation consultant if you have questions or you face problems as you breastfeed. This information is not intended to replace advice given to you by your health care provider. Make sure you discuss any questions you have with your health care provider. Document Released: 10/07/2005 Document Revised: 11/08/2016 Document Reviewed: 11/08/2016 Elsevier Interactive Patient Education  [employer].

## 2018-01-06 NOTE — Progress Notes (Signed)
   PRENATAL VISIT NOTE  Subjective:  Carrie Cameron is a 32 y.o. [id number] at [redacted]w[redacted]d being seen today for ongoing prenatal care.  She is currently monitored for the following issues for this low-risk pregnancy and has Overweight (BMI 25.0-29.9) and Supervision of other normal pregnancy, antepartum on their problem list.  Patient reports no complaints.  Contractions: Not present.  .  Movement: Present. Denies leaking of fluid.   The following portions of the patient's history were reviewed and updated as appropriate: allergies, current medications, past family history, past medical history, past social history, past surgical history and problem list. Problem list updated.  Objective:   Vitals:   01/06/18 0816  BP: 101/69  Pulse: 74  Weight: 151 lb 6.4 oz (68.7 kg)    Fetal Status: Fetal Heart Rate (bpm): 147 Fundal Height: 18 cm Movement: Present     General:  Alert, oriented and cooperative. Patient is in no acute distress.  Skin: Skin is warm and dry. No rash noted.   Cardiovascular: Normal heart rate noted  Respiratory: Normal respiratory effort, no problems with respiration noted  Abdomen: Soft, gravid, appropriate for gestational age.  Pain/Pressure: Absent     Pelvic: Cervical exam deferred        Extremities: Normal range of motion.  Edema: None  Mental Status:  Normal mood and affect. Normal behavior. Normal judgment and thought content.   Assessment and Plan:  Pregnancy: [id number] at [redacted]w[redacted]d  1. Supervision of other normal pregnancy, antepartum Continue routine prenatal care. Anatomy u/s scheduled Normal NIPT  General obstetric precautions including but not limited to vaginal bleeding, contractions, leaking of fluid and fetal movement were reviewed in detail with the patient. Please refer to After Visit Summary for other counseling recommendations.  Return in 4 weeks (on 02/03/2018).   Donnamae Jude, MD

## 2018-01-13 ENCOUNTER — Encounter (HOSPITAL_COMMUNITY): Payer: Self-pay | Admitting: Obstetrics and Gynecology

## 2018-01-19 ENCOUNTER — Other Ambulatory Visit: Payer: Self-pay | Admitting: Obstetrics and Gynecology

## 2018-01-19 ENCOUNTER — Ambulatory Visit (HOSPITAL_COMMUNITY)
Admission: RE | Admit: 2018-01-19 | Discharge: 2018-01-19 | Disposition: A | Discharge status: Home / Self Care | Payer: BLUE CROSS/BLUE SHIELD | Source: Ambulatory Visit | Attending: Obstetrics and Gynecology | Admitting: Obstetrics and Gynecology

## 2018-01-19 DIAGNOSIS — Z363 Encounter for antenatal screening for malformations: Secondary | ICD-10-CM

## 2018-01-19 DIAGNOSIS — Z3480 Encounter for supervision of other normal pregnancy, unspecified trimester: Secondary | ICD-10-CM

## 2018-01-19 DIAGNOSIS — Z3A19 19 weeks gestation of pregnancy: Secondary | ICD-10-CM | POA: Insufficient documentation

## 2018-02-03 ENCOUNTER — Ambulatory Visit (INDEPENDENT_AMBULATORY_CARE_PROVIDER_SITE_OTHER): Payer: BLUE CROSS/BLUE SHIELD | Admitting: Obstetrics & Gynecology

## 2018-02-03 VITALS — BP 104/68 | HR 100 | Wt 155.4 lb

## 2018-02-03 DIAGNOSIS — Z641 Problems related to multiparity: Secondary | ICD-10-CM

## 2018-02-03 DIAGNOSIS — Z3480 Encounter for supervision of other normal pregnancy, unspecified trimester: Secondary | ICD-10-CM

## 2018-02-03 NOTE — Progress Notes (Signed)
Anatomy on 4/1 needs follow up around 5/1

## 2018-02-03 NOTE — Progress Notes (Signed)
   PRENATAL VISIT NOTE  Subjective:  Carrie Cameron is a 32 y.o. [id number] at [redacted]w[redacted]d being seen today for ongoing prenatal care.  She is currently monitored for the following issues for this low-risk pregnancy and has Overweight (BMI 25.0-29.9); [place] multipara; and Supervision of other normal pregnancy, antepartum on their problem list.  Patient reports no complaints.  Contractions: Not present.  .  Movement: Present. Denies leaking of fluid.   The following portions of the patient's history were reviewed and updated as appropriate: allergies, current medications, past family history, past medical history, past social history, past surgical history and problem list. Problem list updated.  Objective:   Vitals:   02/03/18 0838  BP: 104/68  Pulse: 100  Weight: 155 lb 6.4 oz (70.5 kg)    Fetal Status: Fetal Heart Rate (bpm): 144   Movement: Present     General:  Alert, oriented and cooperative. Patient is in no acute distress.  Skin: Skin is warm and dry. No rash noted.   Cardiovascular: Normal heart rate noted  Respiratory: Normal respiratory effort, no problems with respiration noted  Abdomen: Soft, gravid, appropriate for gestational age.  Pain/Pressure: Absent     Pelvic: Cervical exam deferred        Extremities: Normal range of motion.  Edema: None  Mental Status: Normal mood and affect. Normal behavior. Normal judgment and thought content.   Assessment and Plan:  Pregnancy: [id number] at [redacted]w[redacted]d  1. Supervision of other normal pregnancy, antepartum  - US OB Follow Up; Future - schedule early glucola next week  2. [place] multipara   Preterm labor symptoms and general obstetric precautions including but not limited to vaginal bleeding, contractions, leaking of fluid and fetal movement were reviewed in detail with the patient. Please refer to After Visit Summary for other counseling recommendations.  Return in about 1 week (around 02/10/2018) for for glucola.  Future  Appointments  Date Time Provider Department Center  02/03/2018  8:45 AM Emily Filbert, MD [facility]    Emily Filbert, MD

## 2018-02-10 ENCOUNTER — Other Ambulatory Visit: Payer: BLUE CROSS/BLUE SHIELD

## 2018-02-10 DIAGNOSIS — Z3480 Encounter for supervision of other normal pregnancy, unspecified trimester: Secondary | ICD-10-CM

## 2018-02-11 ENCOUNTER — Telehealth: Payer: Self-pay | Admitting: *Deleted

## 2018-02-11 DIAGNOSIS — Z8632 Personal history of gestational diabetes: Secondary | ICD-10-CM | POA: Insufficient documentation

## 2018-02-11 DIAGNOSIS — O24419 Gestational diabetes mellitus in pregnancy, unspecified control: Secondary | ICD-10-CM

## 2018-02-11 DIAGNOSIS — O24415 Gestational diabetes mellitus in pregnancy, controlled by oral hypoglycemic drugs: Secondary | ICD-10-CM

## 2018-02-11 LAB — GLUCOSE TOLERANCE, 2 HOURS W/ 1HR
Glucose, 1 hour: 102 mg/dL (ref 65–179)
Glucose, 2 hour: 105 mg/dL (ref 65–152)
Glucose, Fasting: 93 mg/dL — ABNORMAL HIGH (ref 65–91)

## 2018-02-11 MED ORDER — ACCU-CHEK FASTCLIX LANCETS MISC: 1.0000 [IU] | Freq: Four times a day (QID) | 12 refills | Status: DC

## 2018-02-11 MED ORDER — BLOOD GLUCOSE MONITOR KIT: PACK | 0 refills | Status: DC

## 2018-02-11 MED ORDER — GLUCOSE BLOOD VI STRP: ORAL_STRIP | 12 refills | Status: DC

## 2018-02-11 NOTE — Telephone Encounter (Signed)
Supplies and Referral to N&D sent 02/11/18 Discussed putting her readings in [place] app on 02/11/18

## 2018-02-12 ENCOUNTER — Telehealth: Payer: Self-pay | Admitting: *Deleted

## 2018-02-12 NOTE — Telephone Encounter (Signed)
Pt questioned whether one out of range value on GTT would Dx GDM or if she had to have 2 or more values out of range. Explained to pt that one value determines GDM. She states she has picked up her supplies and waiting on N&D to call her with appointment.

## 2018-02-12 NOTE — Telephone Encounter (Signed)
-----   Message from Blanchie Dessert, [place] sent at 02/12/2018  2:39 PM EDT ----- Regarding: lab result Contact: [phone] Patient needs clarification on Glucose testing result

## 2018-02-16 ENCOUNTER — Other Ambulatory Visit: Payer: Self-pay | Admitting: Obstetrics & Gynecology

## 2018-02-16 ENCOUNTER — Ambulatory Visit (HOSPITAL_COMMUNITY)
Admission: RE | Admit: 2018-02-16 | Discharge: 2018-02-16 | Disposition: A | Discharge status: Home / Self Care | Payer: BLUE CROSS/BLUE SHIELD | Source: Ambulatory Visit | Attending: Obstetrics & Gynecology | Admitting: Obstetrics & Gynecology

## 2018-02-16 DIAGNOSIS — Z362 Encounter for other antenatal screening follow-up: Secondary | ICD-10-CM

## 2018-02-16 DIAGNOSIS — Z3480 Encounter for supervision of other normal pregnancy, unspecified trimester: Secondary | ICD-10-CM

## 2018-02-16 DIAGNOSIS — Z3A23 23 weeks gestation of pregnancy: Secondary | ICD-10-CM | POA: Insufficient documentation

## 2018-02-25 ENCOUNTER — Encounter: Payer: BLUE CROSS/BLUE SHIELD | Attending: Family Medicine | Admitting: Registered"

## 2018-02-25 DIAGNOSIS — O24419 Gestational diabetes mellitus in pregnancy, unspecified control: Secondary | ICD-10-CM

## 2018-02-25 DIAGNOSIS — Z713 Dietary counseling and surveillance: Secondary | ICD-10-CM | POA: Diagnosis not present

## 2018-02-25 DIAGNOSIS — O24415 Gestational diabetes mellitus in pregnancy, controlled by oral hypoglycemic drugs: Secondary | ICD-10-CM | POA: Diagnosis not present

## 2018-03-02 ENCOUNTER — Encounter: Payer: Self-pay | Admitting: Registered"

## 2018-03-02 ENCOUNTER — Encounter: Payer: Self-pay | Admitting: Radiology

## 2018-03-02 NOTE — Progress Notes (Signed)

## 2018-03-03 ENCOUNTER — Ambulatory Visit (INDEPENDENT_AMBULATORY_CARE_PROVIDER_SITE_OTHER): Payer: BLUE CROSS/BLUE SHIELD | Admitting: Obstetrics & Gynecology

## 2018-03-03 VITALS — BP 109/75 | HR 72 | Wt 158.6 lb

## 2018-03-03 DIAGNOSIS — O0992 Supervision of high risk pregnancy, unspecified, second trimester: Secondary | ICD-10-CM

## 2018-03-03 DIAGNOSIS — O24419 Gestational diabetes mellitus in pregnancy, unspecified control: Secondary | ICD-10-CM

## 2018-03-03 MED ORDER — ASPIRIN EC 81 MG PO TBEC: 81.0000 mg | DELAYED_RELEASE_TABLET | Freq: Every day | ORAL | 2 refills | Status: DC

## 2018-03-03 MED ORDER — METFORMIN HCL 500 MG PO TABS: 500.0000 mg | ORAL_TABLET | Freq: Every day | ORAL | 2 refills | Status: DC

## 2018-03-03 NOTE — Patient Instructions (Signed)
Return to clinic for any scheduled appointments or obstetric concerns, or go to [facility] for evaluation  

## 2018-03-03 NOTE — Progress Notes (Signed)
   PRENATAL VISIT NOTE  Subjective:  Carrie Cameron is a 32 y.o. [phone] at [redacted]w[redacted]d being seen today for ongoing prenatal care.  She is currently monitored for the following issues for this high-risk pregnancy and has Overweight (BMI 25.0-29.9); [place] multipara; Supervision of high-risk pregnancy; and Gestational diabetes mellitus (GDM), antepartum on their problem list.  Patient reports no complaints.  Contractions: Not present. Vag. Bleeding: None.  Movement: Present. Denies leaking of fluid.   The following portions of the patient's history were reviewed and updated as appropriate: allergies, current medications, past family history, past medical history, past social history, past surgical history and problem list. Problem list updated.  Objective:   Vitals:   03/03/18 0822  BP: 109/75  Pulse: 72  Weight: 158 lb 9.6 oz (71.9 kg)    Fetal Status: Fetal Heart Rate (bpm): 162 Fundal Height: 26 cm Movement: Present     General:  Alert, oriented and cooperative. Patient is in no acute distress.  Skin: Skin is warm and dry. No rash noted.   Cardiovascular: Normal heart rate noted  Respiratory: Normal respiratory effort, no problems with respiration noted  Abdomen: Soft, gravid, appropriate for gestational age.  Pain/Pressure: Absent     Pelvic: Cervical exam deferred        Extremities: Normal range of motion.  Edema: None  Mental Status: Normal mood and affect. Normal behavior. Normal judgment and thought content.   Blood sugars   Assessment and Plan:  Pregnancy: [id number] at [redacted]w[redacted]d  1. Gestational diabetes mellitus (GDM), antepartum, gestational diabetes method of control unspecified Already on GDM diet and having exercise.  All fasting values elevated. Discussed need for medication therapy. Insulin recommended, patient wants to try oral hypoglycemic instead initially.  Discussed need for antenatal testing staring at 32 weeks, delivery at 39 weeks or earlier if indication.  Importance of good glycemic control emphasized to prevent maternal-fetal morbidity/mortality. - aspirin EC 81 MG tablet; Take 1 tablet (81 mg total) by mouth daily. Take for prevention of preeclampsia later in pregnancy  Dispense: 300 tablet; Refill: 2 - metFORMIN (GLUCOPHAGE) 500 MG tablet; Take 1 tablet (500 mg total) by mouth at bedtime.  Dispense: 30 tablet; Refill: 2  2. Supervision of high risk pregnancy in second trimester Preterm labor symptoms and general obstetric precautions including but not limited to vaginal bleeding, contractions, leaking of fluid and fetal movement were reviewed in detail with the patient. Please refer to After Visit Summary for other counseling recommendations.  Return in about 2 weeks (around 03/17/2018) for OB Visit.  Verita Schneiders, MD

## 2018-03-06 ENCOUNTER — Telehealth: Payer: Self-pay | Admitting: *Deleted

## 2018-03-06 NOTE — Telephone Encounter (Signed)
Received CBG trigger from [employer]. Will send to MD to review and advise.

## 2018-03-17 ENCOUNTER — Ambulatory Visit (INDEPENDENT_AMBULATORY_CARE_PROVIDER_SITE_OTHER): Payer: BLUE CROSS/BLUE SHIELD | Admitting: Obstetrics & Gynecology

## 2018-03-17 VITALS — BP 114/74 | HR 89 | Wt 160.0 lb

## 2018-03-17 DIAGNOSIS — O26892 Other specified pregnancy related conditions, second trimester: Secondary | ICD-10-CM

## 2018-03-17 DIAGNOSIS — O24415 Gestational diabetes mellitus in pregnancy, controlled by oral hypoglycemic drugs: Secondary | ICD-10-CM

## 2018-03-17 DIAGNOSIS — R102 Pelvic and perineal pain: Secondary | ICD-10-CM

## 2018-03-17 DIAGNOSIS — N949 Unspecified condition associated with female genital organs and menstrual cycle: Secondary | ICD-10-CM

## 2018-03-17 DIAGNOSIS — O0992 Supervision of high risk pregnancy, unspecified, second trimester: Secondary | ICD-10-CM

## 2018-03-17 MED ORDER — METFORMIN HCL 500 MG PO TABS: 1000.0000 mg | ORAL_TABLET | Freq: Every day | ORAL | 2 refills | Status: DC

## 2018-03-17 NOTE — Patient Instructions (Addendum)
Return to clinic for any scheduled appointments or obstetric concerns, or go to [facility] for evaluation  TDaP Vaccine Pregnancy Get the Whooping Cough Vaccine While You Are Pregnant (CDC)  It is important for women to get the whooping cough vaccine in the third trimester of each pregnancy. Vaccines are the best way to prevent this disease. There are 2 different whooping cough vaccines. Both vaccines combine protection against whooping cough, tetanus and diphtheria, but they are for different age groups: Tdap: for everyone 11 years or older, including pregnant women  DTaP: for children 2 months through 45 years of age  You need the whooping cough vaccine during each of your pregnancies The recommended time to get the shot is during your 27th through 36th week of pregnancy, preferably during the earlier part of this time period. The Centers for Disease Control and Prevention (CDC) recommends that pregnant women receive the whooping cough vaccine for adolescents and adults (called Tdap vaccine) during the third trimester of each pregnancy. The recommended time to get the shot is during your 27th through 36th week of pregnancy, preferably during the earlier part of this time period. This replaces the original recommendation that pregnant women get the vaccine only if they had not previously received it. The [employer] of [employer]stetricians and Gynecologists and the [employer] support this recommendation.  You should get the whooping cough vaccine while pregnant to pass protection to your baby frame support disabled and/or not supported in this browser  Learn why Mickel Baas decided to get the whooping cough vaccine in her 3rd trimester of pregnancy and how her baby girl was born with some protection against the disease. Also available on YouTube. After receiving the whooping cough vaccine, your body will create protective antibodies (proteins produced by the body to fight off diseases) and  pass some of them to your baby before birth. These antibodies provide your baby some short-term protection against whooping cough in early life. These antibodies can also protect your baby from some of the more serious complications that come along with whooping cough. Your protective antibodies are at their highest about 2 weeks after getting the vaccine, but it takes time to pass them to your baby. So the preferred time to get the whooping cough vaccine is early in your third trimester. The amount of whooping cough antibodies in your body decreases over time. That is why CDC recommends you get a whooping cough vaccine during each pregnancy. Doing so allows each of your babies to get the greatest number of protective antibodies from you. This means each of your babies will get the best protection possible against this disease.  Getting the whooping cough vaccine while pregnant is better than getting the vaccine after you give birth Whooping cough vaccination during pregnancy is ideal so your baby will have short-term protection as soon as he is born. This early protection is important because your baby will not start getting his whooping cough vaccines until he is 2 months old. These first few months of life are when your baby is at greatest risk for catching whooping cough. This is also when he's at greatest risk for having severe, potentially life-threating complications from the infection. To avoid that gap in protection, it is best to get a whooping cough vaccine during pregnancy. You will then pass protection to your baby before he is born. To continue protecting your baby, he should get whooping cough vaccines starting at 2 months old. You may never have gotten the  Tdap vaccine before and did not get it during this pregnancy. If so, you should make sure to get the vaccine immediately after you give birth, before leaving the hospital or birthing center. It will take about 2 weeks before your body  develops protection (antibodies) in response to the vaccine. Once you have protection from the vaccine, you are less likely to give whooping cough to your newborn while caring for him. But remember, your baby will still be at risk for catching whooping cough from others. A recent study looked to see how effective Tdap was at preventing whooping cough in babies whose mothers got the vaccine while pregnant or in the hospital after giving birth. The study found that getting Tdap between 27 through 36 weeks of pregnancy is 85% more effective at preventing whooping cough in babies younger than 2 months old. Blood tests cannot tell if you need a whooping cough vaccine There are no blood tests that can tell you if you have enough antibodies in your body to protect yourself or your baby against whooping cough. Even if you have been sick with whooping cough in the past or previously received the vaccine, you still should get the vaccine during each pregnancy. Breastfeeding may pass some protective antibodies onto your baby By breastfeeding, you may pass some antibodies you have made in response to the vaccine to your baby. When you get a whooping cough vaccine during your pregnancy, you will have antibodies in your breast milk that you can share with your baby as soon as your milk comes in. However, your baby will not get protective antibodies immediately if you wait to get the whooping cough vaccine until after delivering your baby. This is because it takes about 2 weeks for your body to create antibodies. Learn more about the health benefits of breastfeeding. 

## 2018-03-17 NOTE — Progress Notes (Signed)
   PRENATAL VISIT NOTE  Subjective:  Carrie Cameron is a 32 y.o. [phone] at [redacted]w[redacted]d being seen today for ongoing prenatal care.  She is currently monitored for the following issues for this high-risk pregnancy and has Overweight (BMI 25.0-29.9); [place] multipara; Supervision of high-risk pregnancy; and Gestational diabetes mellitus (GDM), antepartum on their problem list.  Patient reports pelvic pressure for a few days; increased vaginal pressure and "tightening in abdomen"   Contractions: Not present. Vag. Bleeding: None.  Movement: Present. Denies leaking of fluid.   The following portions of the patient's history were reviewed and updated as appropriate: allergies, current medications, past family history, past medical history, past social history, past surgical history and problem list. Problem list updated.  Objective:   Vitals:   03/17/18 0948  BP: 114/74  Pulse: 89  Weight: 160 lb (72.6 kg)    Fetal Status: Fetal Heart Rate (bpm): 140 Fundal Height: 27 cm Movement: Present     General:  Alert, oriented and cooperative. Patient is in no acute distress.  Skin: Skin is warm and dry. No rash noted.   Cardiovascular: Normal heart rate noted  Respiratory: Normal respiratory effort, no problems with respiration noted  Abdomen: Soft, gravid, appropriate for gestational age.  Pain/Pressure: Absent     Pelvic: Cervical exam performed Dilation: Closed Effacement (%): Thick Station: Ballotable  Extremities: Normal range of motion.  Edema: None  Mental Status: Normal mood and affect. Normal behavior. Normal judgment and thought content.   Blood Sugars        Assessment and Plan:  Pregnancy: [id number] at [redacted]w[redacted]d  1. Gestational diabetes mellitus (GDM) in second trimester controlled on oral hypoglycemic drug Only 3-4 fasting values within range, improved from last time when there were none.  Increased Metformin to 1000 mg qhs, patient reported normal PP values.  She was told to  record them as well to make sure they are okay. Will check HgA1C next visit.  If fastings do not improve, consider NPH at bedtime.  - metFORMIN (GLUCOPHAGE) 500 MG tablet; Take 2 tablets (1,000 mg total) by mouth at bedtime.  Dispense: 60 tablet; Refill: 2  2. Pelvic pressure in pregnancy, second trimester Closed cervix, patient reassured.   3. Supervision of high risk pregnancy in second trimester Preterm labor symptoms and general obstetric precautions including but not limited to vaginal bleeding, contractions, leaking of fluid and fetal movement were reviewed in detail with the patient. Please refer to After Visit Summary for other counseling recommendations.  Return in about 2 weeks (around 03/31/2018) for OB Visit, 3rd trimester labs, TDap.   Verita Schneiders, MD

## 2018-03-31 ENCOUNTER — Ambulatory Visit (INDEPENDENT_AMBULATORY_CARE_PROVIDER_SITE_OTHER): Payer: BLUE CROSS/BLUE SHIELD | Admitting: Obstetrics & Gynecology

## 2018-03-31 VITALS — BP 110/69 | HR 72 | Wt 159.0 lb

## 2018-03-31 DIAGNOSIS — B9689 Other specified bacterial agents as the cause of diseases classified elsewhere: Secondary | ICD-10-CM | POA: Diagnosis not present

## 2018-03-31 DIAGNOSIS — Z23 Encounter for immunization: Secondary | ICD-10-CM

## 2018-03-31 DIAGNOSIS — N760 Acute vaginitis: Secondary | ICD-10-CM | POA: Diagnosis not present

## 2018-03-31 DIAGNOSIS — Z113 Encounter for screening for infections with a predominantly sexual mode of transmission: Secondary | ICD-10-CM | POA: Diagnosis not present

## 2018-03-31 DIAGNOSIS — O24419 Gestational diabetes mellitus in pregnancy, unspecified control: Secondary | ICD-10-CM

## 2018-03-31 DIAGNOSIS — O0993 Supervision of high risk pregnancy, unspecified, third trimester: Secondary | ICD-10-CM | POA: Diagnosis not present

## 2018-03-31 DIAGNOSIS — O24415 Gestational diabetes mellitus in pregnancy, controlled by oral hypoglycemic drugs: Secondary | ICD-10-CM | POA: Diagnosis not present

## 2018-03-31 MED ORDER — INSULIN NPH (HUMAN) (ISOPHANE) 100 UNIT/ML ~~LOC~~ SUSP: 5.0000 [IU] | Freq: Every day | SUBCUTANEOUS | 3 refills | Status: DC

## 2018-03-31 NOTE — Patient Instructions (Signed)
Return to clinic for any scheduled appointments or obstetric concerns, or go to [facility] for evaluation  

## 2018-03-31 NOTE — Progress Notes (Signed)
   PRENATAL VISIT NOTE  Subjective:  Carrie Cameron is a 32 y.o. [id number] at [redacted]w[redacted]d being seen today for ongoing prenatal care.  She is currently monitored for the following issues for this high-risk pregnancy and has Overweight (BMI 25.0-29.9); [place] multipara; Supervision of high-risk pregnancy; and Gestational diabetes mellitus (GDM), antepartum on their problem list.  Patient reports abnormal vaginal discharge for a few days.  Contractions: Not present.  .  Movement: Present. Denies leaking of fluid.   The following portions of the patient's history were reviewed and updated as appropriate: allergies, current medications, past family history, past medical history, past social history, past surgical history and problem list. Problem list updated.  Objective:   Vitals:   03/31/18 1350  BP: 110/69  Pulse: 72  Weight: 159 lb (72.1 kg)    Fetal Status: Fetal Heart Rate (bpm): 154 Fundal Height: 29 cm Movement: Present     General:  Alert, oriented and cooperative. Patient is in no acute distress.  Skin: Skin is warm and dry. No rash noted.   Cardiovascular: Normal heart rate noted  Respiratory: Normal respiratory effort, no problems with respiration noted  Abdomen: Soft, gravid, appropriate for gestational age.  Pain/Pressure: Absent     Pelvic: Cervical exam deferred        Extremities: Normal range of motion.  Edema: None  Mental Status: Normal mood and affect. Normal behavior. Normal judgment and thought content.      Assessment and Plan:  Pregnancy: [id number] at [redacted]w[redacted]d  1. Gestational diabetes mellitus (GDM) in third trimester, gestational diabetes method of control unspecified Still has elevated fasting BS, despite Metformin 1000 mg qhs. Will switch to insulin and monitor response. - Hemoglobin A1c - insulin NPH Human (HUMULIN N,NOVOLIN N) 100 UNIT/ML injection; Inject 0.05 mLs (5 Units total) into the skin at bedtime.  Dispense: 10 mL; Refill: 3  2. Supervision of  high risk pregnancy in third trimester Third trimester labs done today. - CBC - RPR - HIV antibody - Tdap vaccine greater than or equal to 7yo IM - Cervicovaginal ancillary self-swab for abnormal discharge, will follow up results and manage accordingly.  Preterm labor symptoms and general obstetric precautions including but not limited to vaginal bleeding, contractions, leaking of fluid and fetal movement were reviewed in detail with the patient. Please refer to After Visit Summary for other counseling recommendations.  Return in about 1 week (around 04/07/2018) for OB Visit.  Future Appointments  Date Time Provider [place]  04/08/2018  4:00 PM Kooistra, Mervyn Skeeters, CNM [facility]    Verita Schneiders, MD

## 2018-04-01 LAB — CBC
Hematocrit: 31.7 % — ABNORMAL LOW (ref 34.0–46.6)
Hemoglobin: 10.3 g/dL — ABNORMAL LOW (ref 11.1–15.9)
MCH: 25.2 pg — ABNORMAL LOW (ref 26.6–33.0)
MCHC: 32.5 g/dL (ref 31.5–35.7)
MCV: 78 fL — ABNORMAL LOW (ref 79–97)
Platelets: 295 10*3/uL (ref 150–450)
RBC: 4.09 x10E6/uL (ref 3.77–5.28)
RDW: 15.9 % — ABNORMAL HIGH (ref 12.3–15.4)
WBC: 7.3 10*3/uL (ref 3.4–10.8)

## 2018-04-01 LAB — RPR: RPR Ser Ql: NONREACTIVE

## 2018-04-01 LAB — HIV ANTIBODY (ROUTINE TESTING W REFLEX): HIV Screen 4th Generation wRfx: NONREACTIVE

## 2018-04-01 LAB — HEMOGLOBIN A1C
Est. average glucose Bld gHb Est-mCnc: 117 mg/dL
Hgb A1c MFr Bld: 5.7 % — ABNORMAL HIGH (ref 4.8–5.6)

## 2018-04-02 LAB — CERVICOVAGINAL ANCILLARY ONLY
Bacterial vaginitis: POSITIVE — AB
Candida vaginitis: NEGATIVE
Trichomonas: NEGATIVE

## 2018-04-02 MED ORDER — METRONIDAZOLE 500 MG PO TABS: 500.0000 mg | ORAL_TABLET | Freq: Two times a day (BID) | ORAL | 0 refills | Status: DC

## 2018-04-02 NOTE — Addendum Note (Signed)
Addended by: Verita Schneiders A on: 04/02/2018 11:15 AM   Modules accepted: Orders

## 2018-04-07 ENCOUNTER — Other Ambulatory Visit: Payer: Self-pay | Admitting: *Deleted

## 2018-04-08 ENCOUNTER — Ambulatory Visit (INDEPENDENT_AMBULATORY_CARE_PROVIDER_SITE_OTHER): Payer: BLUE CROSS/BLUE SHIELD | Admitting: Student

## 2018-04-08 DIAGNOSIS — O24415 Gestational diabetes mellitus in pregnancy, controlled by oral hypoglycemic drugs: Secondary | ICD-10-CM

## 2018-04-08 DIAGNOSIS — O0993 Supervision of high risk pregnancy, unspecified, third trimester: Secondary | ICD-10-CM

## 2018-04-08 MED ORDER — GLYBURIDE 2.5 MG PO TABS: 2.5000 mg | ORAL_TABLET | Freq: Every day | ORAL | 3 refills | Status: DC

## 2018-04-08 MED ORDER — METFORMIN HCL 500 MG PO TABS: ORAL_TABLET | ORAL | 2 refills | Status: DC

## 2018-04-08 NOTE — Patient Instructions (Addendum)
-pick up new prescription for glyburide. Take 2.5 mg of glyburide at night.  -take 1000 mg of metformin in the morning and 1000 mg of metformin at night.   Glyburide tablets What is this medicine? GLYBURIDE (GLYE byoor ide) helps to treat type 2 diabetes. Treatment is combined with diet and exercise. The medicine helps your body to use insulin better. This medicine may be used for other purposes; ask your health care provider or pharmacist if you have questions. COMMON BRAND NAME(S): Diabeta, Glycron, Glynase PresTab, Micronase What should I tell my health care provider before I take this medicine? They need to know if you have any of these conditions: -diabetic ketoacidosis -glucose-6-phosphate dehydrogenase deficiency -heart disease -kidney disease -liver disease -severe infection or injury -thyroid disease -an unusual or allergic reaction to glyburide, sulfa drugs, other medicines, foods, dyes, or preservatives -pregnant or trying to get pregnant -breast-feeding How should I use this medicine? Take this medicine by mouth with a glass of water. Follow the directions on the prescription label. If you take this medicine once a day, take it with breakfast or the first main meal of the day. Take your medicine at the same time each day. Do not take more often than directed. Talk to your pediatrician regarding the use of this medicine in children. Special care may be needed. Elderly patients over 54 years old may have a stronger reaction and need a smaller dose. Overdosage: If you think you have taken too much of this medicine contact a poison control center or emergency room at once. NOTE: This medicine is only for you. Do not share this medicine with others. What if I miss a dose? If you miss a dose, take it as soon as you can. If it is almost time for your next dose, take only that dose. Do not take double or extra doses. What may interact with this  medicine? -bosentan -chloramphenicol -cisapride -clarithromycin -medicines for fungal or yeast infections -metoclopramide -probenecid -rifampin -warfarin Many medications may cause an increase or decrease in blood sugar, these include: -alcohol containing beverages -angiotensin converting enzyme inhibitors like enalapril, captopril, and lisinopril -aspirin and aspirin-like drugs -chloramphenicol -chromium -female hormones, like estrogens or progestins and birth control pills -fluoxetine -heart medicines like disopyramide -isoniazid -female hormones or anabolic steroids -medicines called MAO Inhibitors like Nardil, Parnate, Marplan, Eldepryl -medicines for allergies, asthma, cold, or cough -medicines for mental problems -medicines for weight loss -niacin -NSAIDs, medicines for pain and inflammation, like ibuprofen or naproxen -pentamidine -phenytoin -probenecid -quinolone antibiotics like ciprofloxacin, levofloxacin, ofloxacin -some herbal dietary supplements -steroid medicines like prednisone or cortisone -thyroid medicine -water pills or diuretics This list may not describe all possible interactions. Give your health care provider a list of all the medicines, herbs, non-prescription drugs, or dietary supplements you use. Also tell them if you smoke, drink alcohol, or use illegal drugs. Some items may interact with your medicine. What should I watch for while using this medicine? Visit your doctor or health care professional for regular checks on your progress. A test called the HbA1C (A1C) will be monitored. This is a simple blood test. It measures your blood sugar control over the last 2 to 3 months. You will receive this test every 3 to 6 months. Learn how to check your blood sugar. Learn the symptoms of low and high blood sugar and how to manage them. Always carry a quick-source of sugar with you in case you have symptoms of low blood sugar. Examples include hard sugar  candy  or glucose tablets. Make sure others know that you can choke if you eat or drink when you develop serious symptoms of low blood sugar, such as seizures or unconsciousness. They must get medical help at once. Tell your doctor or health care professional if you have high blood sugar. You might need to change the dose of your medicine. If you are sick or exercising more than usual, you might need to change the dose of your medicine. Do not skip meals. Ask your doctor or health care professional if you should avoid alcohol. Many nonprescription cough and cold products contain sugar or alcohol. These can affect blood sugar. This medicine can make you more sensitive to the sun. Keep out of the sun. If you cannot avoid being in the sun, wear protective clothing and use sunscreen. Do not use sun lamps or tanning beds/booths. Wear a medical ID bracelet or chain, and carry a card that describes your disease and details of your medicine and dosage times. What side effects may I notice from receiving this medicine? Side effects that you should report to your doctor or health care professional as soon as possible: -allergic reactions like skin rash, itching or hives, swelling of the face, lips, or tongue -breathing problems -dark urine -fever, chills, sore throat -signs and symptoms of low blood sugar such as feeling anxious, confusion, dizziness, increased hunger, unusually weak or tired, sweating, shakiness, cold, irritable, headache, blurred vision, fast heartbeat, loss of consciousness -unusual bleeding or bruising -yellowing of the eyes or skin Side effects that usually do not require medical attention (report to your doctor or health care professional if they continue or are bothersome): -diarrhea -dizziness -headache -heartburn -nausea -stomach gas This list may not describe all possible side effects. Call your doctor for medical advice about side effects. You may report side effects to FDA at  1-800-FDA-1088. Where should I keep my medicine? Keep out of the reach of children. Store at room temperature between 15 and 30 degrees C (59 and 86 degrees F). Throw away any unused medicine after the expiration date. NOTE: This sheet is a summary. It may not cover all possible information. If you have questions about this medicine, talk to your doctor, pharmacist, or health care provider.  2018 Elsevier/Gold Standard (2013-01-20 14:44:31)

## 2018-04-08 NOTE — Progress Notes (Signed)
Patient ID: Carrie Cameron, female   DOB: 1986/07/26, 32 y.o.   MRN: [id number]    PRENATAL VISIT NOTE  Subjective:  Carrie Cameron is a 32 y.o. [id number] at [redacted]w[redacted]d being seen today for ongoing prenatal care.  She is currently monitored for the following issues for this high-risk pregnancy and has Overweight (BMI 25.0-29.9); Grand multipara; Supervision of high-risk pregnancy; and Gestational diabetes mellitus (GDM), antepartum on their problem list.  Patient reports no complaints.  She has not been able to pick up her insulin which was prescribed for her on 03-31-2018 because it was $500. She was waiting to hear back from [facility] on alternative options; she left a message for a call back on 6-18 but had not heard back.  Contractions: Not present.  .  Movement: Present. Denies leaking of fluid.   The following portions of the patient's history were reviewed and updated as appropriate: allergies, current medications, past family history, past medical history, past social history, past surgical history and problem list. Problem list updated.  Objective:   Vitals:   04/08/18 1617  BP: 95/67  Pulse: 72  Weight: 160 lb (72.6 kg)    Fetal Status: Fetal Heart Rate (bpm): 145 Fundal Height: 30 cm Movement: Present     General:  Alert, oriented and cooperative. Patient is in no acute distress.  Skin: Skin is warm and dry. No rash noted.   Cardiovascular: Normal heart rate noted  Respiratory: Normal respiratory effort, no problems with respiration noted  Abdomen: Soft, gravid, appropriate for gestational age.  Pain/Pressure: Absent     Pelvic: Cervical exam deferred        Extremities: Normal range of motion.  Edema: None  Mental Status: Normal mood and affect. Normal behavior. Normal judgment and thought content.    Reviewed blood sugars from last week from 6-12 to 6-19 on patient's meter as patient had not uploaded them into [facility] yet. (Patient uploaded while in the clinic.)    Fastings are 103, 99, 112, 75 and 112 (did not collect BS on 6/14/ and 6/17).  PP after lunch are 88 (6/13). She has only collected daytime BS one time in the past week.    Assessment and Plan:  Pregnancy: [id number] at [redacted]w[redacted]d  1. Supervision of high risk pregnancy in third trimester -Patient still desires BTL  2. Gestational diabetes mellitus (GDM) controlled on oral hypoglycemic drug, antepartum  Consulted with Dr. Harolyn Rutherford, who is familiar with the patient. Per patient and Dr. Kerry Dory, her daytime BS have been normal but fastings continue to be elevated. She recommends switching to glyburide, rather than insulin, due to cost. She recommends increasing metformin to 1000 mg BID, and adding glyburide 2.5 mg at HS. Continue ASA. Patient is amenable to plan.  Patient will begin antenatal testing at 32 weeks.   Preterm labor symptoms and general obstetric precautions including but not limited to vaginal bleeding, contractions, leaking of fluid and fetal movement were reviewed in detail with the patient. Please refer to After Visit Summary for other counseling recommendations.  Return in about 1 week (around 04/15/2018), or HROB, for needs to see MD at next visit.  Future Appointments  Date Time Provider [place]  04/15/2018  3:30 PM Caren Macadam, MD [facility]    Mervyn Skeeters [place], [place]

## 2018-04-15 ENCOUNTER — Ambulatory Visit (INDEPENDENT_AMBULATORY_CARE_PROVIDER_SITE_OTHER): Payer: BLUE CROSS/BLUE SHIELD | Admitting: Family Medicine

## 2018-04-15 ENCOUNTER — Encounter: Payer: Self-pay | Admitting: Family Medicine

## 2018-04-15 VITALS — BP 118/78 | HR 81 | Wt 162.0 lb

## 2018-04-15 DIAGNOSIS — O24415 Gestational diabetes mellitus in pregnancy, controlled by oral hypoglycemic drugs: Secondary | ICD-10-CM

## 2018-04-15 DIAGNOSIS — Z641 Problems related to multiparity: Secondary | ICD-10-CM

## 2018-04-15 DIAGNOSIS — O0993 Supervision of high risk pregnancy, unspecified, third trimester: Secondary | ICD-10-CM

## 2018-04-15 NOTE — Progress Notes (Signed)
   PRENATAL VISIT NOTE  Subjective:  Carrie Cameron is a 32 y.o. [phone] at [redacted]w[redacted]d being seen today for ongoing prenatal care.  She is currently monitored for the following issues for this low-risk pregnancy and has Overweight (BMI 25.0-29.9); Grand multipara; Supervision of high-risk pregnancy; and Gestational diabetes mellitus (GDM), antepartum on their problem list.  Patient reports diarrhea- from metformin starte.  Contractions: Not present. Vag. Bleeding: None.  Movement: Present. Denies leaking of fluid.   Fasting 90-104 (only 1 above 95) PP- 95-147 (only 1 >120)  Reports improved fasting with glyburide  The following portions of the patient's history were reviewed and updated as appropriate: allergies, current medications, past family history, past medical history, past social history, past surgical history and problem list. Problem list updated.  Objective:   Vitals:   04/15/18 1539  BP: 118/78  Pulse: 81  Weight: 162 lb (73.5 kg)    Fetal Status: Fetal Heart Rate (bpm): 150   Movement: Present     General:  Alert, oriented and cooperative. Patient is in no acute distress.  Skin: Skin is warm and dry. No rash noted.   Cardiovascular: Normal heart rate noted  Respiratory: Normal respiratory effort, no problems with respiration noted  Abdomen: Soft, gravid, appropriate for gestational age.  Pain/Pressure: Present     Pelvic: Cervical exam deferred        Extremities: Normal range of motion.  Edema: None  Mental Status: Normal mood and affect. Normal behavior. Normal judgment and thought content.   Assessment and Plan:  Pregnancy: [id number] at [redacted]w[redacted]d  1. Supervision of high risk pregnancy in third trimester UTD currently  Confirmed desire for BTL  2. [place] multipara Risk for [place]  3. Gestational diabetes mellitus (GDM) controlled on oral hypoglycemic drug, antepartum Improved control on metformin + glyburide Start 2x weekly NST next week-- Mon/Fri given  holiday [place] ordered for 35-36 wks Encouraged continued diet changes and exercise   Preterm labor symptoms and general obstetric precautions including but not limited to vaginal bleeding, contractions, leaking of fluid and fetal movement were reviewed in detail with the patient. Please refer to After Visit Summary for other counseling recommendations.  Return in about 2 weeks (around 04/29/2018) for Routine prenatal care.  Future Appointments  Date Time Provider [place]  04/20/2018  4:00 PM [facility] NURSE [facility]  04/24/2018  8:15 AM [facility] NURSE [facility]  04/27/2018  4:00 PM [facility] NURSE [facility]  04/30/2018 11:30 AM Donnamae Jude, MD [facility]  05/13/2018 11:15 AM [facility] [place] 4 [facility]    Caren Macadam, MD

## 2018-04-19 ENCOUNTER — Other Ambulatory Visit: Payer: Self-pay | Admitting: Obstetrics & Gynecology

## 2018-04-19 DIAGNOSIS — O24415 Gestational diabetes mellitus in pregnancy, controlled by oral hypoglycemic drugs: Secondary | ICD-10-CM

## 2018-04-20 ENCOUNTER — Ambulatory Visit (INDEPENDENT_AMBULATORY_CARE_PROVIDER_SITE_OTHER): Payer: BLUE CROSS/BLUE SHIELD

## 2018-04-20 VITALS — BP 108/70 | HR 70 | Wt 163.0 lb

## 2018-04-20 DIAGNOSIS — O24415 Gestational diabetes mellitus in pregnancy, controlled by oral hypoglycemic drugs: Secondary | ICD-10-CM | POA: Diagnosis not present

## 2018-04-20 NOTE — Progress Notes (Signed)
Nurse visit for NST dx: GDM. Pt taking Glyburide and Metformin. NST Reactive

## 2018-04-20 NOTE — Progress Notes (Signed)
Patient seen and assessed by nursing staff.  Agree with documentation and plan.  

## 2018-04-20 NOTE — Progress Notes (Signed)
NST:  Baseline: 135 bpm, Variability: Good {> 6 bpm), Accelerations: Reactive and Decelerations: Absent   

## 2018-04-22 ENCOUNTER — Other Ambulatory Visit: Payer: Self-pay | Admitting: Obstetrics & Gynecology

## 2018-04-22 DIAGNOSIS — O24419 Gestational diabetes mellitus in pregnancy, unspecified control: Secondary | ICD-10-CM

## 2018-04-24 ENCOUNTER — Ambulatory Visit (INDEPENDENT_AMBULATORY_CARE_PROVIDER_SITE_OTHER): Payer: BLUE CROSS/BLUE SHIELD

## 2018-04-24 DIAGNOSIS — O24415 Gestational diabetes mellitus in pregnancy, controlled by oral hypoglycemic drugs: Secondary | ICD-10-CM | POA: Diagnosis not present

## 2018-04-24 NOTE — Progress Notes (Signed)
Nurse visit for NST only dx: GDM. NST-R reviewed with MD.

## 2018-04-27 ENCOUNTER — Ambulatory Visit (INDEPENDENT_AMBULATORY_CARE_PROVIDER_SITE_OTHER): Payer: BLUE CROSS/BLUE SHIELD

## 2018-04-27 VITALS — BP 109/70 | HR 70

## 2018-04-27 DIAGNOSIS — O24419 Gestational diabetes mellitus in pregnancy, unspecified control: Secondary | ICD-10-CM

## 2018-04-27 DIAGNOSIS — O0990 Supervision of high risk pregnancy, unspecified, unspecified trimester: Secondary | ICD-10-CM

## 2018-04-27 NOTE — Progress Notes (Signed)
NST Note Date: 04/24/2018 Gestational Age: 30/0 FHT: 140 baseline, positive accelerations, negative deceleration, moderate variability Toco: 1 contraction Time: 35 minutes  A/P: reactive NST. Continue with current plan of care  Durene Romans MD Attending Center for [facility])

## 2018-04-30 ENCOUNTER — Encounter (INDEPENDENT_AMBULATORY_CARE_PROVIDER_SITE_OTHER): Payer: Self-pay

## 2018-04-30 ENCOUNTER — Ambulatory Visit (INDEPENDENT_AMBULATORY_CARE_PROVIDER_SITE_OTHER): Payer: BLUE CROSS/BLUE SHIELD | Admitting: Family Medicine

## 2018-04-30 VITALS — BP 109/72 | HR 72 | Wt 165.0 lb

## 2018-04-30 DIAGNOSIS — O24415 Gestational diabetes mellitus in pregnancy, controlled by oral hypoglycemic drugs: Secondary | ICD-10-CM

## 2018-04-30 DIAGNOSIS — O0990 Supervision of high risk pregnancy, unspecified, unspecified trimester: Secondary | ICD-10-CM

## 2018-04-30 NOTE — Patient Instructions (Signed)

## 2018-04-30 NOTE — Progress Notes (Signed)
   PRENATAL VISIT NOTE  Subjective:  Carrie Cameron is a 32 y.o. [phone] at [redacted]w[redacted]d being seen today for ongoing prenatal care.  She is currently monitored for the following issues for this high-risk pregnancy and has Overweight (BMI 25.0-29.9); Grand multipara; Supervision of high-risk pregnancy; and Gestational diabetes mellitus (GDM), antepartum on their problem list.  Patient reports no complaints.  Contractions: Irregular.  .  Movement: Present. Denies leaking of fluid.   The following portions of the patient's history were reviewed and updated as appropriate: allergies, current medications, past family history, past medical history, past social history, past surgical history and problem list. Problem list updated.  Objective:   Vitals:   04/30/18 1546  BP: 109/72  Pulse: 72  Weight: 165 lb (74.8 kg)    Fetal Status:     Movement: Present     General:  Alert, oriented and cooperative. Patient is in no acute distress.  Skin: Skin is warm and dry. No rash noted.   Cardiovascular: Normal heart rate noted  Respiratory: Normal respiratory effort, no problems with respiration noted  Abdomen: Soft, gravid, appropriate for gestational age.        Pelvic: Cervical exam deferred        Extremities: Normal range of motion.     Mental Status: Normal mood and affect. Normal behavior. Normal judgment and thought content.   Assessment and Plan:  Pregnancy: [id number] at [redacted]w[redacted]d  1. Gestational diabetes mellitus (GDM) controlled on oral hypoglycemic drug, antepartum FBS 78-115 2 hour pp 81-116 CBG's are well controlled with addition of glyburide. Continue Metformin and ASA. Returns to morrow for NST  2. Supervision of high risk pregnancy, antepartum   Preterm labor symptoms and general obstetric precautions including but not limited to vaginal bleeding, contractions, leaking of fluid and fetal movement were reviewed in detail with the patient. Please refer to After Visit Summary  for other counseling recommendations.  Return in 1 week (on 05/07/2018) for NST only in 4 days, NST in 1 wk with OB visit and AFI.  Future Appointments  Date Time Provider [place]  05/01/2018  9:30 AM [facility] NURSE [facility]  05/07/2018 10:30 AM Anyanwu, Sallyanne Havers, MD [facility]  05/13/2018 11:15 AM [facility] [place] 4 [facility]    Donnamae Jude, MD

## 2018-05-01 ENCOUNTER — Ambulatory Visit: Payer: BLUE CROSS/BLUE SHIELD

## 2018-05-01 DIAGNOSIS — O0993 Supervision of high risk pregnancy, unspecified, third trimester: Secondary | ICD-10-CM

## 2018-05-01 DIAGNOSIS — O24415 Gestational diabetes mellitus in pregnancy, controlled by oral hypoglycemic drugs: Secondary | ICD-10-CM

## 2018-05-01 NOTE — Progress Notes (Signed)
NST

## 2018-05-03 ENCOUNTER — Inpatient Hospital Stay (HOSPITAL_COMMUNITY)
Admission: AD | Admit: 2018-05-03 | Discharge: 2018-05-03 | Disposition: A | Discharge status: Home / Self Care | Payer: BLUE CROSS/BLUE SHIELD | Source: Ambulatory Visit | Attending: Obstetrics & Gynecology | Admitting: Obstetrics & Gynecology

## 2018-05-03 ENCOUNTER — Encounter (HOSPITAL_COMMUNITY): Payer: Self-pay

## 2018-05-03 ENCOUNTER — Other Ambulatory Visit: Payer: Self-pay

## 2018-05-03 DIAGNOSIS — O24415 Gestational diabetes mellitus in pregnancy, controlled by oral hypoglycemic drugs: Secondary | ICD-10-CM | POA: Diagnosis not present

## 2018-05-03 DIAGNOSIS — Z7984 Long term (current) use of oral hypoglycemic drugs: Secondary | ICD-10-CM | POA: Insufficient documentation

## 2018-05-03 DIAGNOSIS — Z7982 Long term (current) use of aspirin: Secondary | ICD-10-CM | POA: Insufficient documentation

## 2018-05-03 DIAGNOSIS — O479 False labor, unspecified: Secondary | ICD-10-CM

## 2018-05-03 DIAGNOSIS — O4700 False labor before 37 completed weeks of gestation, unspecified trimester: Secondary | ICD-10-CM

## 2018-05-03 DIAGNOSIS — Z79899 Other long term (current) drug therapy: Secondary | ICD-10-CM | POA: Insufficient documentation

## 2018-05-03 DIAGNOSIS — R109 Unspecified abdominal pain: Secondary | ICD-10-CM | POA: Diagnosis not present

## 2018-05-03 DIAGNOSIS — O4703 False labor before 37 completed weeks of gestation, third trimester: Secondary | ICD-10-CM | POA: Insufficient documentation

## 2018-05-03 DIAGNOSIS — Z3A34 34 weeks gestation of pregnancy: Secondary | ICD-10-CM | POA: Diagnosis not present

## 2018-05-03 DIAGNOSIS — Z3689 Encounter for other specified antenatal screening: Secondary | ICD-10-CM

## 2018-05-03 LAB — URINALYSIS, ROUTINE W REFLEX MICROSCOPIC
Bilirubin Urine: NEGATIVE
Glucose, UA: NEGATIVE mg/dL
Ketones, ur: NEGATIVE mg/dL
Nitrite: NEGATIVE
Protein, ur: NEGATIVE mg/dL
Specific Gravity, Urine: 1.012 (ref 1.005–1.030)
pH: 6 (ref 5.0–8.0)

## 2018-05-03 NOTE — MAU Note (Addendum)
G6P5 @ 34.[redacted] wksga. Here for ctx that started last night. States q10-15 mins apart. Rates pain at 5/10. Denies LOF or bleeding. +FM but not as much.  EFM applied. FHR 150. Turned pt to right lateral.   1335: provider at bs reassessing. SVE unchanged. D/c orders given by the provider.   1335: d/c instructions given with pt understanding.   1345 Pt left unit via ambulatory.

## 2018-05-03 NOTE — Discharge Instructions (Signed)
Braxton Hicks Contractions °Contractions of the uterus can occur throughout pregnancy, but they are not always a sign that you are in labor. You may have practice contractions called Braxton Hicks contractions. These false labor contractions are sometimes confused with true labor. °What are Braxton Hicks contractions? °Braxton Hicks contractions are tightening movements that occur in the muscles of the uterus before labor. Unlike true labor contractions, these contractions do not result in opening (dilation) and thinning of the cervix. Toward the end of pregnancy (32-34 weeks), Braxton Hicks contractions can happen more often and may become stronger. These contractions are sometimes difficult to tell apart from true labor because they can be very uncomfortable. You should not feel embarrassed if you go to the hospital with false labor. °Sometimes, the only way to tell if you are in true labor is for your health care provider to look for changes in the cervix. The health care provider will do a physical exam and may monitor your contractions. If you are not in true labor, the exam should show that your cervix is not dilating and your water has not broken. °If there are other health problems associated with your pregnancy, it is completely safe for you to be sent home with false labor. You may continue to have Braxton Hicks contractions until you go into true labor. °How to tell the difference between true labor and false labor °True labor °· Contractions last 30-70 seconds. °· Contractions become very regular. °· Discomfort is usually felt in the top of the uterus, and it spreads to the lower abdomen and low back. °· Contractions do not go away with walking. °· Contractions usually become more intense and increase in frequency. °· The cervix dilates and gets thinner. °False labor °· Contractions are usually shorter and not as strong as true labor contractions. °· Contractions are usually irregular. °· Contractions  are often felt in the front of the lower abdomen and in the groin. °· Contractions may go away when you walk around or change positions while lying down. °· Contractions get weaker and are shorter-lasting as time goes on. °· The cervix usually does not dilate or become thin. °Follow these instructions at home: °· Take over-the-counter and prescription medicines only as told by your health care provider. °· Keep up with your usual exercises and follow other instructions from your health care provider. °· Eat and drink lightly if you think you are going into labor. °· If Braxton Hicks contractions are making you uncomfortable: °? Change your position from lying down or resting to walking, or change from walking to resting. °? Sit and rest in a tub of warm water. °? Drink enough fluid to keep your urine pale yellow. Dehydration may cause these contractions. °? Do slow and deep breathing several times an hour. °· Keep all follow-up prenatal visits as told by your health care provider. This is important. °Contact a health care provider if: °· You have a fever. °· You have continuous pain in your abdomen. °Get help right away if: °· Your contractions become stronger, more regular, and closer together. °· You have fluid leaking or gushing from your vagina. °· You pass blood-tinged mucus (bloody show). °· You have bleeding from your vagina. °· You have low back pain that you never had before. °· You feel your baby’s head pushing down and causing pelvic pressure. °· Your baby is not moving inside you as much as it used to. °Summary °· Contractions that occur before labor are called Braxton   Hicks contractions, false labor, or practice contractions. °· Braxton Hicks contractions are usually shorter, weaker, farther apart, and less regular than true labor contractions. True labor contractions usually become progressively stronger and regular and they become more frequent. °· Manage discomfort from Braxton Hicks contractions by  changing position, resting in a warm bath, drinking plenty of water, or practicing deep breathing. °This information is not intended to replace advice given to you by your health care provider. Make sure you discuss any questions you have with your health care provider. °Document Released: 02/20/2017 Document Revised: 02/20/2017 Document Reviewed: 02/20/2017 °Elsevier Interactive Patient Education © 2018 [employer]. ° °

## 2018-05-03 NOTE — MAU Provider Note (Signed)
History  CSN: [id number] Arrival date and time: 05/03/18 1109  First Provider Initiated Contact with Patient 05/03/18 1204      Chief Complaint  Patient presents with  . Contractions    HPI: Carrie Cameron is a 32 y.o. [phone] with IUP at 57w2dwho presents to maternity admissions reporting contractions since last night and have not improved. Reports contractions are about every 10 minutes, but also feeling pressure. Denis LOF, vaginal bleeding, dysuria, hematuria, fever or chills. Reports good fetal movement.   [facility] complicated by A2 GDM.   OB History  Gravida Para Term Preterm AB Living  10 5 5  0 4 5  SAB TAB Ectopic Multiple Live Births  4 0 0 0 5    # Outcome Date GA Lbr Len/2nd Weight Sex Delivery Anes PTL Lv  10 Current           9 SAB 07/21/16     SAB     8 Term 09/03/12 420w0d3.175 kg (7 lb) M Vag-Spont   LIV  7 SAB 2013 6w14w0d      6 Term 07/31/08 40w73w0d536 kg (10 lb) F    LIV  5 Term 01/27/07 40w047w0d29 kg (8 lb) F    LIV  4 SAB 2007 [redacted]w[redacted]d 347w0d   3 Term 06/03/04 [redacted]w[redacted]d [redacted]w[redacted]d kg (8 lb) M Vag-Spont   LIV  2 Term 11/25/02 [redacted]w[redacted]d  68w0dkg (8 lb) M Vag-Spont   LIV     Complications: Postpartum hemorrhage  1 SAB      SAB      Past Medical History:  Diagnosis Date  . Medical history non-contributory    Past Surgical History:  Procedure Laterality Date  . NO PAST SURGERIES     Family History  Problem Relation Age of Onset  . Diabetes Maternal Uncle    Social History   Socioeconomic History  . Marital status: Single    Spouse name: Not on file  . Number of children: Not on file  . Years of education: Not on file  . Highest education level: Not on file  Occupational History  . Not on file  Social Needs  . Financial resource strain: Not on file  . Food insecurity:    Worry: Not on file    Inability: Not on file  . Transportation needs:    Medical: Not on file    Non-medical: Not on file  Tobacco Use   . Smoking status: Never Smoker  . Smokeless tobacco: Never Used  Substance and Sexual Activity  . Alcohol use: No  . Drug use: No  . Sexual activity: Yes    Birth control/protection: Pill  Lifestyle  . Physical activity:    Days per week: Not on file    Minutes per session: Not on file  . Stress: Not on file  Relationships  . Social connections:    Talks on phone: Not on file    Gets together: Not on file    Attends religious service: Not on file    Active member of club or organization: Not on file    Attends meetings of clubs or organizations: Not on file    Relationship status: Not on file  . Intimate partner violence:    Fear of current or ex partner: Not on file    Emotionally abused: Not on file    Physically  abused: Not on file    Forced sexual activity: Not on file  Other Topics Concern  . Not on file  Social History Narrative  . Not on file   No Known Allergies  Medications Prior to Admission  Medication Sig Dispense Refill Last Dose  . ACCU-CHEK FASTCLIX LANCETS MISC 1 Units by Percutaneous route 4 (four) times daily. 100 each 12 Taking  . acetaminophen (TYLENOL) 500 MG tablet Take 500 mg by mouth every 6 (six) hours as needed.   Taking  . aspirin EC 81 MG tablet Take 1 tablet (81 mg total) by mouth daily. Take for prevention of preeclampsia later in pregnancy 300 tablet 2 Taking  . blood glucose meter kit and supplies KIT Dispense based on patient and insurance preference. Use up to four times daily as directed. (FOR ICD-9 250.00, 250.01). 1 each 0 Taking  . ferrous sulfate 325 (65 FE) MG EC tablet Take 325 mg by mouth 3 (three) times daily with meals.   Taking  . glucose blood test strip Use as instructed 100 each 12 Taking  . glyBURIDE (DIABETA) 2.5 MG tablet Take 1 tablet (2.5 mg total) by mouth at bedtime. 60 tablet 3 Taking  . metFORMIN (GLUCOPHAGE) 1000 MG tablet Take 1 tablet (1,000 mg total) by mouth 2 (two) times daily with a meal. 60 tablet 2 Taking  .  Prenatal Vit-Fe Fumarate-FA (PRENATAL MULTIVITAMIN) TABS tablet Take 1 tablet by mouth daily at 12 noon.   Taking    I have reviewed patient's Past Medical Hx, Surgical Hx, Family Hx, Social Hx, medications and allergies.   Review of Systems: Negative except for what is mentioned in HPI.  Physical Exam   Blood pressure (!) 94/55, pulse 89, temperature 99 F (37.2 C), temperature source Oral, resp. rate 18, height 5' (1.524 m), weight 75 kg (165 lb 4.8 oz), last menstrual period 09/05/2017, SpO2 98 %, unknown if currently breastfeeding.  Constitutional: Well-developed, well-nourished female in no acute distress.  HENT: [place]/AT, normal oropharynx mucosa.  Eyes: normal conjunctivae, no scleral icterus Cardiovascular: normal rate Respiratory: normal effort GI: Abd soft, non-tender, gravid appropriate for gestational age.  . SVE: 1cm/thick/-3, posterior MSK: Extremities nontender, no edema Neurologic: Alert and oriented x 4. Psych: Normal mood and affect Skin: warm and dry   FHT:  Baseline 145 , moderate variability, accelerations present, no decelerations Toco: irregular contractions, q 3 to 10 minutes  MAU Course/MDM:   Nursing notes and VS reviewed. Patient seen and examined, as noted above.  SVE reassuring. Contractions irregular and mild. Obs for an hour with po hyration  1:41 PM: SVE unchanged   Assessment and Plan  Assessment: 1. Preterm contractions   2. NST (non-stress test) reactive   3. [redacted] weeks gestation of pregnancy     Plan: --Discharge home in stable condition.  --PTL precautions and fetal kick counts   Degele, Jenne Pane, MD 05/03/2018 1:41 PM

## 2018-05-04 LAB — CULTURE, OB URINE: Culture: NO GROWTH

## 2018-05-07 ENCOUNTER — Encounter: Payer: BLUE CROSS/BLUE SHIELD | Admitting: Obstetrics & Gynecology

## 2018-05-07 NOTE — Progress Notes (Deleted)
   Patient did not show up today for her scheduled appointment.   Nazli Penn, MD, FACOG Obstetrician & Gynecologist, [facility], [facility]  

## 2018-05-08 ENCOUNTER — Ambulatory Visit (HOSPITAL_COMMUNITY)
Admission: RE | Admit: 2018-05-08 | Discharge: 2018-05-08 | Disposition: A | Discharge status: Home / Self Care | Payer: BLUE CROSS/BLUE SHIELD | Source: Ambulatory Visit | Attending: Family Medicine | Admitting: Family Medicine

## 2018-05-08 ENCOUNTER — Other Ambulatory Visit: Payer: Self-pay | Admitting: Family Medicine

## 2018-05-08 DIAGNOSIS — Z362 Encounter for other antenatal screening follow-up: Secondary | ICD-10-CM

## 2018-05-08 DIAGNOSIS — Z3A35 35 weeks gestation of pregnancy: Secondary | ICD-10-CM | POA: Diagnosis not present

## 2018-05-08 DIAGNOSIS — O24415 Gestational diabetes mellitus in pregnancy, controlled by oral hypoglycemic drugs: Secondary | ICD-10-CM

## 2018-05-11 ENCOUNTER — Ambulatory Visit (INDEPENDENT_AMBULATORY_CARE_PROVIDER_SITE_OTHER): Payer: BLUE CROSS/BLUE SHIELD | Admitting: Family Medicine

## 2018-05-11 VITALS — BP 113/78 | HR 77 | Wt 166.0 lb

## 2018-05-11 DIAGNOSIS — O0993 Supervision of high risk pregnancy, unspecified, third trimester: Secondary | ICD-10-CM

## 2018-05-11 DIAGNOSIS — O24415 Gestational diabetes mellitus in pregnancy, controlled by oral hypoglycemic drugs: Secondary | ICD-10-CM

## 2018-05-11 MED ORDER — GLYBURIDE 2.5 MG PO TABS: 5.0000 mg | ORAL_TABLET | Freq: Every day | ORAL | 3 refills | Status: DC

## 2018-05-11 NOTE — Progress Notes (Signed)
Subjective:  Yitta Gongaware is a 32 y.o. [id number] at [redacted]w[redacted]d being seen today for ongoing prenatal care.  She is currently monitored for the following issues for this high-risk pregnancy and has Overweight (BMI 25.0-29.9); Grand multipara; Supervision of high-risk pregnancy; and Gestational diabetes mellitus (GDM), antepartum on their problem list.  GDM: Patient taking metformin 1000mg  BID, glyburide 2.5mg  at bedtime.  Reports no hypoglycemic episodes.  Tolerating medication well Fasting: 97, 91, 100, 96, 87 2hr PP: controlled  Patient reports no complaints.  Contractions: Irregular. Vag. Bleeding: None.  Movement: Present. Denies leaking of fluid.   The following portions of the patient's history were reviewed and updated as appropriate: allergies, current medications, past family history, past medical history, past social history, past surgical history and problem list. Problem list updated.  Objective:   Vitals:   05/11/18 1325  BP: 113/78  Pulse: 77  Weight: 166 lb (75.3 kg)    Fetal Status: Fetal Heart Rate (bpm): 144   Movement: Present     General:  Alert, oriented and cooperative. Patient is in no acute distress.  Skin: Skin is warm and dry. No rash noted.   Cardiovascular: Normal heart rate noted  Respiratory: Normal respiratory effort, no problems with respiration noted  Abdomen: Soft, gravid, appropriate for gestational age. Pain/Pressure: Present     Pelvic: Vag. Bleeding: None     Cervical exam deferred        Extremities: Normal range of motion.  Edema: None  Mental Status: Normal mood and affect. Normal behavior. Normal judgment and thought content.   Urinalysis:      Assessment and Plan:  Pregnancy: [id number] at [redacted]w[redacted]d  1. Supervision of high risk pregnancy in third trimester FHT and Fh normal  2. Gestational diabetes mellitus (GDM) controlled on oral hypoglycemic drug, antepartum Discussed checking more frequently for better compliance. Will try checking  1hr PP this week. Increase glyburide to 5mg  at night. Continue metformin, ASA BPP 8/8 Friday. Discussed NST - will do next appt. EFW: 6#9oz, AC >97%    Preterm labor symptoms and general obstetric precautions including but not limited to vaginal bleeding, contractions, leaking of fluid and fetal movement were reviewed in detail with the patient. Please refer to After Visit Summary for other counseling recommendations.  No follow-ups on file.   Truett Mainland, DO

## 2018-05-11 NOTE — Progress Notes (Signed)
Patient declines NST - states she was advised a NST was no longer needed and needs to get back to work.

## 2018-05-13 ENCOUNTER — Ambulatory Visit (HOSPITAL_COMMUNITY): Admission: RE | Admit: 2018-05-13 | Payer: BLUE CROSS/BLUE SHIELD | Source: Ambulatory Visit

## 2018-05-15 ENCOUNTER — Ambulatory Visit (HOSPITAL_COMMUNITY)
Admission: RE | Admit: 2018-05-15 | Discharge: 2018-05-15 | Disposition: A | Discharge status: Home / Self Care | Payer: BLUE CROSS/BLUE SHIELD | Source: Ambulatory Visit | Attending: Family Medicine | Admitting: Family Medicine

## 2018-05-15 DIAGNOSIS — Z3A36 36 weeks gestation of pregnancy: Secondary | ICD-10-CM | POA: Diagnosis not present

## 2018-05-15 DIAGNOSIS — O24415 Gestational diabetes mellitus in pregnancy, controlled by oral hypoglycemic drugs: Secondary | ICD-10-CM | POA: Diagnosis not present

## 2018-05-18 ENCOUNTER — Other Ambulatory Visit: Payer: Self-pay

## 2018-05-18 ENCOUNTER — Inpatient Hospital Stay (HOSPITAL_COMMUNITY)
Admission: AD | Admit: 2018-05-18 | Discharge: 2018-05-19 | Disposition: A | Discharge status: Home / Self Care | Payer: BLUE CROSS/BLUE SHIELD | Source: Ambulatory Visit | Attending: Obstetrics and Gynecology | Admitting: Obstetrics and Gynecology

## 2018-05-18 ENCOUNTER — Ambulatory Visit (INDEPENDENT_AMBULATORY_CARE_PROVIDER_SITE_OTHER): Payer: BLUE CROSS/BLUE SHIELD | Admitting: Obstetrics & Gynecology

## 2018-05-18 ENCOUNTER — Encounter (HOSPITAL_COMMUNITY): Payer: Self-pay | Admitting: *Deleted

## 2018-05-18 VITALS — BP 112/74 | HR 86 | Wt 166.8 lb

## 2018-05-18 DIAGNOSIS — O0990 Supervision of high risk pregnancy, unspecified, unspecified trimester: Secondary | ICD-10-CM | POA: Diagnosis not present

## 2018-05-18 DIAGNOSIS — Z3A37 37 weeks gestation of pregnancy: Secondary | ICD-10-CM | POA: Insufficient documentation

## 2018-05-18 DIAGNOSIS — O479 False labor, unspecified: Secondary | ICD-10-CM

## 2018-05-18 DIAGNOSIS — O26893 Other specified pregnancy related conditions, third trimester: Secondary | ICD-10-CM | POA: Diagnosis not present

## 2018-05-18 DIAGNOSIS — O24415 Gestational diabetes mellitus in pregnancy, controlled by oral hypoglycemic drugs: Secondary | ICD-10-CM | POA: Diagnosis not present

## 2018-05-18 DIAGNOSIS — Z113 Encounter for screening for infections with a predominantly sexual mode of transmission: Secondary | ICD-10-CM | POA: Diagnosis not present

## 2018-05-18 DIAGNOSIS — O0993 Supervision of high risk pregnancy, unspecified, third trimester: Secondary | ICD-10-CM

## 2018-05-18 DIAGNOSIS — Z3A36 36 weeks gestation of pregnancy: Secondary | ICD-10-CM

## 2018-05-18 HISTORY — DX: Gestational (pregnancy-induced) hypertension without significant proteinuria, unspecified trimester: O13.9

## 2018-05-18 HISTORY — DX: Gestational diabetes mellitus in pregnancy, unspecified control: O24.419

## 2018-05-18 NOTE — Patient Instructions (Signed)
Return to clinic for any scheduled appointments or obstetric concerns, or go to [facility] for evaluation  

## 2018-05-18 NOTE — MAU Note (Signed)
Pt reports uc's q 5 min for one hour

## 2018-05-18 NOTE — Progress Notes (Signed)
PRENATAL VISIT NOTE  Subjective:  Carrie Cameron is a 32 y.o. [phone] at [redacted]w[redacted]d being seen today for ongoing prenatal care.  She is currently monitored for the following issues for this high-risk pregnancy and has Overweight (BMI 25.0-29.9); Grand multipara; Supervision of high-risk pregnancy; and Gestational diabetes mellitus (GDM), antepartum on their problem list.  Patient reports no complaints.  Contractions: Irritability. Vag. Bleeding: None.  Movement: Present. Denies leaking of fluid.   The following portions of the patient's history were reviewed and updated as appropriate: allergies, current medications, past family history, past medical history, past social history, past surgical history and problem list. Problem list updated.  Objective:   Vitals:   05/18/18 1504  BP: 112/74  Pulse: 86  Weight: 166 lb 12.8 oz (75.7 kg)    Fetal Status: Fetal Heart Rate (bpm): 143 Fundal Height: 36 cm Movement: Present  Presentation: Vertex  General:  Alert, oriented and cooperative. Patient is in no acute distress.  Skin: Skin is warm and dry. No rash noted.   Cardiovascular: Normal heart rate noted  Respiratory: Normal respiratory effort, no problems with respiration noted  Abdomen: Soft, gravid, appropriate for gestational age.  Pain/Pressure: Present     Pelvic: Cervical exam performed Dilation: 4 Effacement (%): 50 Station: -3  Extremities: Normal range of motion.  Edema: Trace  Mental Status: Normal mood and affect. Normal behavior. Normal judgment and thought content.   [place] Mfm Fetal Bpp Wo Non Stress  Result Date: 05/15/2018 ----------------------------------------------------------------------  OBSTETRICS REPORT                      (Signed Final 05/15/2018 03:59 pm) ---------------------------------------------------------------------- Patient Info  ID #:       [id number]                          D.O.B.:  28-Apr-1986 (32 yrs)  Name:       Carrie Cameron                   Visit  Date: 05/15/2018 01:04 pm              [place] ---------------------------------------------------------------------- Performed By  Performed By:     Hubert Azure          Ref. Address:     [place]  Attending:        Tama High MD        Location:         [facility]  Referred By:      [facility] for                    [facility]                    Healthcare ---------------------------------------------------------------------- Orders   #  Description  Code   1  [place] MFM FETAL BPP WO NON STRESS              76819.01  ----------------------------------------------------------------------   #  Ordered By               Order #        Accession #    Episode #   1  Darron Doom              [id number]      [id number]     [id number]  ---------------------------------------------------------------------- Indications   [redacted] weeks gestation of pregnancy                Z3A.36   Gestational diabetes in pregnancy,             O24.415   controlled by oral hypoglycemic drugs  ---------------------------------------------------------------------- OB History  Blood Type:            Height:  5'0"   Weight (lb):  149       BMI:  29.1  Gravidity:    10        Term:   5        Prem:   0        SAB:   4  TOP:          0       Ectopic:  0        Living: 5 ---------------------------------------------------------------------- Fetal Evaluation  Num Of Fetuses:     1  Cardiac Activity:   Observed  Presentation:       Cephalic  Amniotic Fluid  AFI FV:      Subjectively within normal limits  AFI Sum(cm)     %Tile       Largest Pocket(cm)  9.49            18          4.25  RUQ(cm)       RLQ(cm)       LUQ(cm)  4.25          1.75          3.49 ---------------------------------------------------------------------- Biophysical Evaluation  Amniotic F.V:   Within normal limits       F. Tone:         Observed  F. Movement:    Observed                   Score:          8/8  F. Breathing:   Observed ---------------------------------------------------------------------- Gestational Age  LMP:           36w 0d        Date:  09/05/17                 EDD:   06/12/18  Best:          Harolyn Rutherford 0d     Det. By:  LMP  (09/05/17)          EDD:   06/12/18 ---------------------------------------------------------------------- Impression  GDM. Patient takes glyburide and metformin.  Amniotic fluid is normal and good fetal activity is seen.  BPP 8/8. ----------------------------------------------------------------------                  Tama High, MD Electronically Signed Final Report   05/15/2018 03:59 pm ----------------------------------------------------------------------  [place] Mfm Fetal Bpp Wo Non Stress  Result Date: 05/08/2018 ----------------------------------------------------------------------  OBSTETRICS REPORT                      (  Signed Final 05/08/2018 12:17 pm) ---------------------------------------------------------------------- Patient Info  ID #:       [id number]                          D.O.B.:  [birth year] (32 yrs)  Name:       Carrie Cameron                   Visit Date: 05/08/2018 11:28 am              [place] ---------------------------------------------------------------------- Performed By  Performed By:     Elisabeth Cara        Ref. Address:     [place]  Attending:        Pricilla Handler MD       Location:         [facility]  Referred By:      [facility] for                    [place] ---------------------------------------------------------------------- Orders   #  Description                                 Code   1  [place] MFM FETAL BPP WO NON STRESS              54627.03   2  [place] MFM OB FOLLOW UP                         50093.81   ----------------------------------------------------------------------   #  Ordered By               Order #        Accession #    Episode #   1  Darron Doom              [id number]      [id number]     [id number]   [place]              [id number]      [id number]     [id number]  ---------------------------------------------------------------------- Indications   [redacted] weeks gestation of pregnancy                Z3A.35   Gestational diabetes in pregnancy,             O24.415   controlled by oral hypoglycemic drugs   Encounter for other antenatal screening        Z36.2   follow-up  ---------------------------------------------------------------------- OB History  Blood Type:            Height:  5'0"   Weight (lb):  149       BMI:  29.1  Gravidity:    10        Term:   5        Prem:   0        SAB:   4  TOP:          0       Ectopic:  0        Living: 5 ---------------------------------------------------------------------- Fetal Evaluation  Num Of Fetuses:     1  Fetal Heart         142  Rate(bpm):  Cardiac Activity:   Observed  Presentation:       Cephalic  Placenta:           Posterior  P. Cord Insertion:  Previously Visualized  Amniotic Fluid  AFI FV:      Subjectively within normal limits  AFI Sum(cm)     %Tile       Largest Pocket(cm)  12.49           39          4.54  RUQ(cm)       RLQ(cm)       LUQ(cm)        LLQ(cm)  2.23          3.75          1.97           4.54 ---------------------------------------------------------------------- Biophysical Evaluation  Amniotic F.V:   Pocket => 2 cm two         F. Tone:        Observed                  planes  F. Movement:    Observed                   Score:          8/8  F. Breathing:   Observed ---------------------------------------------------------------------- Biometry  BPD:      88.7  mm     G. Age:  35w 6d         76  %    CI:        73.63   %    70 - 86                                                          FL/HC:      20.7   %    20.1 - 22.3  HC:      328.4   mm     G. Age:  37w 2d         74  %    HC/AC:      0.98        0.93 - 1.11  AC:      334.1  mm     G. Age:  37w 2d       > 97  %    FL/BPD:     76.7   %    71 - 87  FL:         68  mm     G. Age:  34w 6d         41  %  FL/AC:      20.4   %    20 - 24  HUM:      59.4  mm     G. Age:  34w 3d         61  %  Est. FW:    2974  gm      6 lb 9 oz     85  % ---------------------------------------------------------------------- Gestational Age  LMP:           35w 0d        Date:  09/05/17                 EDD:   06/12/18  U/S Today:     36w 2d                                        EDD:   06/03/18  Best:          35w 0d     Det. By:  LMP  (09/05/17)          EDD:   06/12/18 ---------------------------------------------------------------------- Anatomy  Cranium:               Appears normal         Aortic Arch:            Previously seen  Cavum:                 Previously seen        Ductal Arch:            Previously seen  Ventricles:            Appears normal         Diaphragm:              Previously seen  Choroid Plexus:        Previously seen        Stomach:                Appears normal, left                                                                        sided  Cerebellum:            Previously seen        Abdomen:                Appears normal  Posterior Fossa:       Previously seen        Abdominal Wall:         Previously seen  Nuchal Fold:           Previously seen        Cord Vessels:           Previously seen  Face:                  Orbits and profile     Kidneys:                Appear normal  previously seen  Lips:                  Previously seen        Bladder:                Appears normal  Thoracic:              Appears normal         Spine:                  Previously seen  Heart:                 Previously seen        Upper Extremities:      Previously seen  RVOT:                  Previously seen        Lower Extremities:      Previously seen  LVOT:                  Appears  normal  Other:  Female gender. Heels prevvisualized. Nasal bone prev visualized. 5th          digit visualized. Technically difficult due to fetal position. ---------------------------------------------------------------------- Cervix Uterus Adnexa  Cervix  Not visualized (advanced GA >29wks)  Uterus  No abnormality visualized.  Left Ovary  Not visualized.  Right Ovary  Not visualized.  Adnexa:       No abnormality visualized. No adnexal mass                visualized. ---------------------------------------------------------------------- Recommendations  Fetal weight estimation in 3 weeks is recommended. ----------------------------------------------------------------------                 Pricilla Handler, MD Electronically Signed Final Report   05/08/2018 12:17 pm ----------------------------------------------------------------------  [place] Mfm Ob Follow Up  Result Date: 05/08/2018 ----------------------------------------------------------------------  OBSTETRICS REPORT                      (Signed Final 05/08/2018 12:17 pm) ---------------------------------------------------------------------- Patient Info  ID #:       [id number]                          D.O.B.:  August 10, 1986 (32 yrs)  Name:       Carrie Cameron                   Visit Date: 05/08/2018 11:28 am              [place] ---------------------------------------------------------------------- Performed By  Performed By:     Elisabeth Cara        Ref. Address:     [place]  Attending:        Pricilla Handler MD       Location:         [facility]  Referred By:      [facility] for                    [employer] ---------------------------------------------------------------------- Orders   #  Description                                 Code   1  [place] MFM FETAL BPP WO NON STRESS              76819.01   2  [place] MFM OB  FOLLOW UP                         81448.18  ----------------------------------------------------------------------   #  Ordered By               Order #        Accession #    Episode #   1  Darron Doom              [id number]      [id number]     [id number]   2  Darron Doom              [id number]      [id number]     [id number]  ---------------------------------------------------------------------- Indications   [redacted] weeks gestation of pregnancy                Z3A.35   Gestational diabetes in pregnancy,             O24.415   controlled by oral hypoglycemic drugs   Encounter for other antenatal screening        Z36.2   follow-up  ---------------------------------------------------------------------- OB History  Blood Type:            Height:  5'0"   Weight (lb):  149       BMI:  29.1  Gravidity:    10        Term:   5        Prem:   0        SAB:   4  TOP:          0       Ectopic:  0        Living: 5 ---------------------------------------------------------------------- Fetal Evaluation  Num Of Fetuses:     1  Fetal Heart         142  Rate(bpm):  Cardiac Activity:   Observed  Presentation:       Cephalic  Placenta:           Posterior  P. Cord Insertion:  Previously Visualized  Amniotic Fluid  AFI FV:      Subjectively within normal limits  AFI Sum(cm)     %Tile       Largest Pocket(cm)  12.49           39          4.54  RUQ(cm)       RLQ(cm)       LUQ(cm)        LLQ(cm)  2.23          3.75          1.97           4.54 ---------------------------------------------------------------------- Biophysical Evaluation  Amniotic F.V:   Pocket => 2  cm two         F. Tone:        Observed                  planes  F. Movement:    Observed                   Score:          8/8  F. Breathing:   Observed ---------------------------------------------------------------------- Biometry  BPD:      88.7  mm     G. Age:  35w 6d         76  %    CI:        73.63   %    70 - 86                                                          FL/HC:       20.7   %    20.1 - 22.3  HC:      328.4  mm     G. Age:  37w 2d         74  %    HC/AC:      0.98        0.93 - 1.11  AC:      334.1  mm     G. Age:  37w 2d       > 97  %    FL/BPD:     76.7   %    71 - 87  FL:         68  mm     G. Age:  34w 6d         41  %    FL/AC:      20.4   %    20 - 24  HUM:      59.4  mm     G. Age:  34w 3d         74  %  Est. FW:    2974  gm      6 lb 9 oz     85  % ---------------------------------------------------------------------- Gestational Age  LMP:           35w 0d        Date:  09/05/17                 EDD:   06/12/18  U/S Today:     36w 2d                                        EDD:   06/03/18  Best:          35w 0d     Det. By:  LMP  (09/05/17)          EDD:   06/12/18 ---------------------------------------------------------------------- Anatomy  Cranium:               Appears normal         Aortic Arch:            Previously seen  Cavum:  Previously seen        Ductal Arch:            Previously seen  Ventricles:            Appears normal         Diaphragm:              Previously seen  Choroid Plexus:        Previously seen        Stomach:                Appears normal, left                                                                        sided  Cerebellum:            Previously seen        Abdomen:                Appears normal  Posterior Fossa:       Previously seen        Abdominal Wall:         Previously seen  Nuchal Fold:           Previously seen        Cord Vessels:           Previously seen  Face:                  Orbits and profile     Kidneys:                Appear normal                         previously seen  Lips:                  Previously seen        Bladder:                Appears normal  Thoracic:              Appears normal         Spine:                  Previously seen  Heart:                 Previously seen        Upper Extremities:      Previously seen  RVOT:                  Previously seen        Lower Extremities:       Previously seen  LVOT:                  Appears normal  Other:  Female gender. Heels prevvisualized. Nasal bone prev visualized. 5th          digit visualized. Technically difficult due to fetal position. ---------------------------------------------------------------------- Cervix Uterus Adnexa  Cervix  Not visualized (advanced GA >29wks)  Uterus  No abnormality visualized.  Left Ovary  Not visualized.  Right Ovary  Not visualized.  Adnexa:  No abnormality visualized. No adnexal mass                visualized. ---------------------------------------------------------------------- Recommendations  Fetal weight estimation in 3 weeks is recommended. ----------------------------------------------------------------------                 Pricilla Handler, MD Electronically Signed Final Report   05/08/2018 12:17 pm ----------------------------------------------------------------------   Assessment and Plan:  Pregnancy: [id number] at [redacted]w[redacted]d  1. Gestational diabetes mellitus (GDM) controlled on oral hypoglycemic drug, antepartum CBGs within control on oral meds.  NST performed today was reviewed and was found to be reactive.  Continue recommended antenatal testing and prenatal care. - US Fetal BPP W/O Non Stress; Future  2. Supervision of high risk pregnancy, antepartum Pelvic cultures done - GC/Chlamydia probe amp ([place])not at [facility] - Strep Gp B NAA Preterm labor symptoms and general obstetric precautions including but not limited to vaginal bleeding, contractions, leaking of fluid and fetal movement were reviewed in detail with the patient. Please refer to After Visit Summary for other counseling recommendations.  Return for OB visits and antenatal testing as scheduled.  Future Appointments  Date Time Provider [place]  05/22/2018  8:15 AM [place] 4 [facility]-M[facility]US M[facility]-US  05/25/2018  1:00 PM Anyanwu, Sallyanne Havers, MD [facility]  05/29/2018  8:45 AM [facility] [place] 2 WH-MFCUS MFC-US  06/01/2018   1:00 PM Anyanwu, Sallyanne Havers, MD [facility]  06/05/2018  8:45 AM [place] 2 WH-MFCUS MFC-US    Verita Schneiders, MD

## 2018-05-19 DIAGNOSIS — O26893 Other specified pregnancy related conditions, third trimester: Secondary | ICD-10-CM | POA: Diagnosis not present

## 2018-05-19 DIAGNOSIS — Z3A37 37 weeks gestation of pregnancy: Secondary | ICD-10-CM | POA: Diagnosis not present

## 2018-05-19 LAB — URINALYSIS, ROUTINE W REFLEX MICROSCOPIC
Bacteria, UA: NONE SEEN
Bilirubin Urine: NEGATIVE
Glucose, UA: NEGATIVE mg/dL
Ketones, ur: NEGATIVE mg/dL
Leukocytes, UA: NEGATIVE
Nitrite: NEGATIVE
Protein, ur: 30 mg/dL — AB
Specific Gravity, Urine: 1.023 (ref 1.005–1.030)
pH: 5 (ref 5.0–8.0)

## 2018-05-19 NOTE — MAU Note (Signed)
I have communicated with  Wende Bushy, CNM and reviewed vital signs:  Vitals:   05/18/18 2306 05/19/18 0042  BP: 128/74 107/74  Pulse: 75 79  Resp: 18   Temp: 98.2 F (36.8 C)   SpO2: 99%     Vaginal exam:  Dilation: 4.5 Effacement (%): 50 Cervical Position: Middle Station: -2 Presentation: Vertex Exam by:: T Nolon Lennert RN ,   Also reviewed contraction pattern and that non-stress test is reactive.  It has been documented that patient is contracting every 4-8  minutes with no cervical change over 1 hour  not indicating active labor.  Patient denies any other complaints.  Based on this report provider has given order for discharge.  A discharge order and diagnosis entered by a provider.   Labor discharge instructions reviewed with patient.

## 2018-05-19 NOTE — Discharge Instructions (Signed)

## 2018-05-20 LAB — STREP GP B NAA: Strep Gp B NAA: NEGATIVE

## 2018-05-21 ENCOUNTER — Inpatient Hospital Stay: Payer: BLUE CROSS/BLUE SHIELD | Admitting: Anesthesiology

## 2018-05-21 ENCOUNTER — Inpatient Hospital Stay
Admission: EM | Admit: 2018-05-21 | Discharge: 2018-05-23 | DRG: 798 | Disposition: A | Discharge status: Home / Self Care | Payer: BLUE CROSS/BLUE SHIELD | Attending: Obstetrics and Gynecology | Admitting: Obstetrics and Gynecology

## 2018-05-21 DIAGNOSIS — Z23 Encounter for immunization: Secondary | ICD-10-CM | POA: Diagnosis not present

## 2018-05-21 DIAGNOSIS — Z9851 Tubal ligation status: Secondary | ICD-10-CM

## 2018-05-21 DIAGNOSIS — O894 Spinal and epidural anesthesia-induced headache during the puerperium: Secondary | ICD-10-CM | POA: Diagnosis not present

## 2018-05-21 DIAGNOSIS — O0943 Supervision of pregnancy with grand multiparity, third trimester: Secondary | ICD-10-CM | POA: Diagnosis not present

## 2018-05-21 DIAGNOSIS — Z3483 Encounter for supervision of other normal pregnancy, third trimester: Secondary | ICD-10-CM | POA: Diagnosis not present

## 2018-05-21 DIAGNOSIS — O24425 Gestational diabetes mellitus in childbirth, controlled by oral hypoglycemic drugs: Secondary | ICD-10-CM | POA: Diagnosis not present

## 2018-05-21 DIAGNOSIS — O9902 Anemia complicating childbirth: Secondary | ICD-10-CM | POA: Diagnosis present

## 2018-05-21 DIAGNOSIS — D649 Anemia, unspecified: Secondary | ICD-10-CM | POA: Diagnosis present

## 2018-05-21 DIAGNOSIS — Z302 Encounter for sterilization: Secondary | ICD-10-CM

## 2018-05-21 DIAGNOSIS — O09293 Supervision of pregnancy with other poor reproductive or obstetric history, third trimester: Secondary | ICD-10-CM | POA: Diagnosis not present

## 2018-05-21 DIAGNOSIS — Z3A37 37 weeks gestation of pregnancy: Secondary | ICD-10-CM | POA: Diagnosis not present

## 2018-05-21 LAB — TYPE AND SCREEN
ABO/RH(D): O POS
Antibody Screen: NEGATIVE

## 2018-05-21 LAB — CBC
HCT: 28.2 % — ABNORMAL LOW (ref 35.0–47.0)
Hemoglobin: 9.2 g/dL — ABNORMAL LOW (ref 12.0–16.0)
MCH: 24.3 pg — ABNORMAL LOW (ref 26.0–34.0)
MCHC: 32.7 g/dL (ref 32.0–36.0)
MCV: 74.3 fL — ABNORMAL LOW (ref 80.0–100.0)
Platelets: 274 10*3/uL (ref 150–440)
RBC: 3.8 MIL/uL (ref 3.80–5.20)
RDW: 17 % — ABNORMAL HIGH (ref 11.5–14.5)
WBC: 7.5 10*3/uL (ref 3.6–11.0)

## 2018-05-21 LAB — GLUCOSE, CAPILLARY: Glucose-Capillary: 77 mg/dL (ref 70–99)

## 2018-05-21 MED ORDER — LIDOCAINE HCL (PF) 1 % IJ SOLN
INTRAMUSCULAR | Status: DC | PRN
  Administered 2018-05-21 (×3): 1 mL via INTRADERMAL

## 2018-05-21 MED ORDER — PHENYLEPHRINE 40 MCG/ML (10ML) SYRINGE FOR IV PUSH (FOR BLOOD PRESSURE SUPPORT)
80.0000 ug | PREFILLED_SYRINGE | INTRAVENOUS | Status: DC | PRN
  Filled 2018-05-21: qty 5

## 2018-05-21 MED ORDER — DIPHENHYDRAMINE HCL 50 MG/ML IJ SOLN: 12.5000 mg | INTRAMUSCULAR | Status: DC | PRN

## 2018-05-21 MED ORDER — SOD CITRATE-CITRIC ACID 500-334 MG/5ML PO SOLN
30.0000 mL | ORAL | Status: DC | PRN
Start: 2018-05-21 — End: 2018-05-22

## 2018-05-21 MED ORDER — LACTATED RINGERS IV SOLN: 500.0000 mL | Freq: Once | INTRAVENOUS | Status: DC

## 2018-05-21 MED ORDER — OXYTOCIN BOLUS FROM INFUSION: 500.0000 mL | Freq: Once | INTRAVENOUS | Status: DC

## 2018-05-21 MED ORDER — LACTATED RINGERS IV SOLN
Freq: Once | INTRAVENOUS | Status: AC
  Administered 2018-05-21: 22:00:00 via INTRAVENOUS

## 2018-05-21 MED ORDER — ACETAMINOPHEN 325 MG PO TABS: 650.0000 mg | ORAL_TABLET | ORAL | Status: DC | PRN

## 2018-05-21 MED ORDER — FENTANYL 2.5 MCG/ML W/ROPIVACAINE 0.15% IN NS 100 ML EPIDURAL (ARMC)
12.0000 mL/h | EPIDURAL | Status: DC
  Administered 2018-05-21: 12 mL/h via EPIDURAL
  Filled 2018-05-21: qty 100

## 2018-05-21 MED ORDER — OXYTOCIN 40 UNITS IN LACTATED RINGERS INFUSION - SIMPLE MED: 2.5000 [IU]/h | INTRAVENOUS | Status: DC

## 2018-05-21 MED ORDER — LIDOCAINE HCL (PF) 1 % IJ SOLN: 30.0000 mL | INTRAMUSCULAR | Status: DC | PRN

## 2018-05-21 MED ORDER — BUTORPHANOL TARTRATE 1 MG/ML IJ SOLN: 1.0000 mg | INTRAMUSCULAR | Status: DC | PRN

## 2018-05-21 MED ORDER — EPHEDRINE 5 MG/ML INJ
10.0000 mg | INTRAVENOUS | Status: DC | PRN
  Filled 2018-05-21: qty 2

## 2018-05-21 MED ORDER — ONDANSETRON HCL 4 MG/2ML IJ SOLN: 4.0000 mg | Freq: Four times a day (QID) | INTRAMUSCULAR | Status: DC | PRN

## 2018-05-21 MED ORDER — LIDOCAINE-EPINEPHRINE (PF) 1.5 %-1:200000 IJ SOLN
INTRAMUSCULAR | Status: DC | PRN
  Administered 2018-05-21: 3 mL via EPIDURAL

## 2018-05-21 MED ORDER — LACTATED RINGERS IV SOLN
500.0000 mL | INTRAVENOUS | Status: DC | PRN
Start: 2018-05-21 — End: 2018-05-22

## 2018-05-21 MED ORDER — SODIUM CHLORIDE 0.9 % IV SOLN
INTRAVENOUS | Status: DC | PRN
  Administered 2018-05-21 (×2): 5 mL via EPIDURAL

## 2018-05-21 NOTE — Anesthesia Procedure Notes (Signed)
Epidural Patient location during procedure: OB Start time: 05/21/2018 10:54 PM End time: 05/21/2018 11:09 PM  Staffing Anesthesiologist: Piscitello, Precious Haws, MD Performed: anesthesiologist   Preanesthetic Checklist Completed: patient identified, site marked, surgical consent, pre-op evaluation, timeout performed, IV checked, risks and benefits discussed and monitors and equipment checked  Epidural Patient position: sitting Prep: ChloraPrep Patient monitoring: heart rate, continuous pulse ox and blood pressure Approach: midline Location: L4-L5 Injection technique: LOR saline  Needle:  Needle type: Tuohy  Needle gauge: 17 G Needle length: 9 cm and 9 Needle insertion depth: 5 cm Catheter type: closed end flexible Catheter size: 19 Gauge Catheter at skin depth: 10 cm Test dose: negative and 1.5% lidocaine with Epi 1:200 K  Assessment Sensory level: T10 Events: blood not aspirated, injection not painful, no injection resistance, negative IV test and no paresthesia  Additional Notes 3 attempts.  First attempt at L2-3, patient had transient right sided paresthesia that resolved with needle retraction.  2nd attempt at L3-4, loss of resistance but unable to thread catheter.   3rd attempt was successful attempt at L4-5. Pt. Evaluated and documentation done after procedure finished. Patient identified. Risks/Benefits/Options discussed with patient including but not limited to bleeding, infection, nerve damage, paralysis, failed block, incomplete pain control, headache, blood pressure changes, nausea, vomiting, reactions to medication both or allergic, itching and postpartum back pain. Confirmed with bedside nurse the patient's most recent platelet count. Confirmed with patient that they are not currently taking any anticoagulation, have any bleeding history or any family history of bleeding disorders. Patient expressed understanding and wished to proceed. All questions were answered. Sterile  technique was used throughout the entire procedure. Please see nursing notes for vital signs. Test dose was given through epidural catheter and negative prior to continuing to dose epidural or start infusion. Warning signs of high block given to the patient including shortness of breath, tingling/numbness in hands, complete motor block, or any concerning symptoms with instructions to call for help. Patient was given instructions on fall risk and not to get out of bed. All questions and concerns addressed with instructions to call with any issues or inadequate analgesia.   Patient tolerated the insertion well without immediate complications.Reason for block:procedure for pain

## 2018-05-21 NOTE — OB Triage Note (Signed)
CC: Contractions   Patient came in at 2000 from home complaining of contractions that started since this morning. Patient reports contractions to be every 5-6 minutes apart. Rating pain a 7 out of 10. Patient reports positive fetal movement. Denies vaginal bleeding. Denies leakage of fluid. Upon RN assessment contractions palpate moderate. External fetal monitors applied. Tracing contractions every 3-5 minutes apart with fetal tracing in the 140s with moderate variability at this time. Initial blood pressure 119/80. Temperature 98.4 F.  Will inform provider and continue to assess and monitor.  Call bell within reach. Bed in lowest position. Side rails up.

## 2018-05-21 NOTE — Anesthesia Preprocedure Evaluation (Signed)
Anesthesia Evaluation  Patient identified by MRN, date of birth, ID band Patient awake    Reviewed: Allergy & Precautions, H&P , NPO status , Patient's Chart, lab work & pertinent test results  History of Anesthesia Complications Negative for: history of anesthetic complications  Airway Mallampati: III  TM Distance: >3 FB Neck ROM: full    Dental  (+) Chipped   Pulmonary neg pulmonary ROS,           Cardiovascular Exercise Tolerance: Good hypertension,      Neuro/Psych    GI/Hepatic negative GI ROS,   Endo/Other  diabetes, Gestational  Renal/GU   negative genitourinary   Musculoskeletal   Abdominal   Peds  Hematology negative hematology ROS (+)   Anesthesia Other Findings Past Medical History: No date: Gestational diabetes No date: Medical history non-contributory No date: Pregnancy induced hypertension  Past Surgical History: No date: NO PAST SURGERIES  BMI    Body Mass Index:  32.61 kg/m      Reproductive/Obstetrics (+) Pregnancy                             Anesthesia Physical Anesthesia Plan  ASA: III  Anesthesia Plan: Epidural   Post-op Pain Management:    Induction:   PONV Risk Score and Plan:   Airway Management Planned:   Additional Equipment:   Intra-op Plan:   Post-operative Plan:   Informed Consent: I have reviewed the patients History and Physical, chart, labs and discussed the procedure including the risks, benefits and alternatives for the proposed anesthesia with the patient or authorized representative who has indicated his/her understanding and acceptance.     Plan Discussed with: Anesthesiologist  Anesthesia Plan Comments:         Anesthesia Quick Evaluation

## 2018-05-22 ENCOUNTER — Encounter: Payer: Self-pay | Admitting: *Deleted

## 2018-05-22 ENCOUNTER — Other Ambulatory Visit: Payer: Self-pay

## 2018-05-22 ENCOUNTER — Ambulatory Visit (HOSPITAL_COMMUNITY): Payer: BLUE CROSS/BLUE SHIELD

## 2018-05-22 DIAGNOSIS — O09293 Supervision of pregnancy with other poor reproductive or obstetric history, third trimester: Secondary | ICD-10-CM

## 2018-05-22 DIAGNOSIS — Z3A37 37 weeks gestation of pregnancy: Secondary | ICD-10-CM

## 2018-05-22 DIAGNOSIS — O0943 Supervision of pregnancy with grand multiparity, third trimester: Secondary | ICD-10-CM

## 2018-05-22 MED ORDER — SIMETHICONE 80 MG PO CHEW: 80.0000 mg | CHEWABLE_TABLET | ORAL | Status: DC | PRN

## 2018-05-22 MED ORDER — IBUPROFEN 600 MG PO TABS
600.0000 mg | ORAL_TABLET | Freq: Four times a day (QID) | ORAL | Status: DC
  Administered 2018-05-22 – 2018-05-23 (×5): 600 mg via ORAL
  Filled 2018-05-22 (×5): qty 1

## 2018-05-22 MED ORDER — PRENATAL MULTIVITAMIN CH
1.0000 | ORAL_TABLET | Freq: Every day | ORAL | Status: DC
  Administered 2018-05-22: 1 via ORAL
  Filled 2018-05-22: qty 1

## 2018-05-22 MED ORDER — TERBUTALINE SULFATE 1 MG/ML IJ SOLN: 0.2500 mg | Freq: Once | INTRAMUSCULAR | Status: DC | PRN

## 2018-05-22 MED ORDER — OXYCODONE-ACETAMINOPHEN 5-325 MG PO TABS
1.0000 | ORAL_TABLET | ORAL | Status: DC | PRN
  Administered 2018-05-22 – 2018-05-23 (×4): 1 via ORAL
  Filled 2018-05-22 (×5): qty 1

## 2018-05-22 MED ORDER — ZOLPIDEM TARTRATE 5 MG PO TABS: 5.0000 mg | ORAL_TABLET | Freq: Every evening | ORAL | Status: DC | PRN

## 2018-05-22 MED ORDER — ACETAMINOPHEN 325 MG PO TABS
650.0000 mg | ORAL_TABLET | ORAL | Status: DC | PRN
  Administered 2018-05-23: 650 mg via ORAL
  Filled 2018-05-22: qty 2

## 2018-05-22 MED ORDER — BENZOCAINE-MENTHOL 20-0.5 % EX AERO: 1.0000 "application " | INHALATION_SPRAY | CUTANEOUS | Status: DC | PRN

## 2018-05-22 MED ORDER — OXYTOCIN 40 UNITS IN LACTATED RINGERS INFUSION - SIMPLE MED
1.0000 m[IU]/min | INTRAVENOUS | Status: DC
  Administered 2018-05-22: 2 m[IU]/min via INTRAVENOUS

## 2018-05-22 MED ORDER — IBUPROFEN 600 MG PO TABS
ORAL_TABLET | ORAL | Status: AC
  Filled 2018-05-22: qty 1

## 2018-05-22 MED ORDER — DOCUSATE SODIUM 100 MG PO CAPS
100.0000 mg | ORAL_CAPSULE | Freq: Two times a day (BID) | ORAL | Status: DC
  Administered 2018-05-22: 100 mg via ORAL
  Filled 2018-05-22: qty 1

## 2018-05-22 MED ORDER — FAMOTIDINE 20 MG PO TABS
40.0000 mg | ORAL_TABLET | Freq: Once | ORAL | Status: AC
  Administered 2018-05-22: 40 mg via ORAL
  Filled 2018-05-22: qty 2

## 2018-05-22 MED ORDER — LACTATED RINGERS IV SOLN
INTRAVENOUS | Status: DC
  Administered 2018-05-22: 20 mL/h via INTRAVENOUS
  Administered 2018-05-23: 10:00:00 via INTRAVENOUS

## 2018-05-22 MED ORDER — TETANUS-DIPHTH-ACELL PERTUSSIS 5-2.5-18.5 LF-MCG/0.5 IM SUSP: 0.5000 mL | Freq: Once | INTRAMUSCULAR | Status: DC

## 2018-05-22 MED ORDER — SODIUM CHLORIDE 0.9% FLUSH: 10.0000 mL | Freq: Three times a day (TID) | INTRAVENOUS | Status: DC

## 2018-05-22 MED ORDER — OXYTOCIN 40 UNITS IN LACTATED RINGERS INFUSION - SIMPLE MED
2.5000 [IU]/h | INTRAVENOUS | Status: DC | PRN
  Filled 2018-05-22: qty 1000

## 2018-05-22 MED ORDER — METOCLOPRAMIDE HCL 10 MG PO TABS
10.0000 mg | ORAL_TABLET | Freq: Once | ORAL | Status: AC
  Administered 2018-05-22: 10 mg via ORAL
  Filled 2018-05-22: qty 1

## 2018-05-22 MED ORDER — DIPHENHYDRAMINE HCL 25 MG PO CAPS: 25.0000 mg | ORAL_CAPSULE | Freq: Four times a day (QID) | ORAL | Status: DC | PRN

## 2018-05-22 MED ORDER — SODIUM CHLORIDE 0.9% FLUSH
10.0000 mL | INTRAVENOUS | Status: DC | PRN
  Administered 2018-05-22: 10 mL via INTRAVENOUS

## 2018-05-22 NOTE — Anesthesia Postprocedure Evaluation (Signed)
Anesthesia Post Note  Patient: Carrie Cameron  Procedure(s) Performed: AN AD [place]  Patient location during evaluation: [facility] Anesthesia Type: Epidural Level of consciousness: awake, awake and alert, oriented and patient cooperative Pain management: pain level controlled Vital Signs Assessment: post-procedure vital signs reviewed and stable Respiratory status: spontaneous breathing, nonlabored ventilation and respiratory function stable Cardiovascular status: stable Postop Assessment: patient able to bend at knees and no apparent nausea or vomiting Comments: Pt complained of headache, right leg tingling from knee to foot.  Pt can move and feel both legs.  Recommended DP precautions.  Fluids, caffiene, laying flat, and pain relievers.  Will round on her tmw.  Spoke to MDA JP at 0700 05/22/18 about this patient andcurrent status.       Last Vitals:  Vitals:   05/22/18 0515 05/22/18 0608  BP: 112/74 109/66  Pulse: 72 69  Resp: 18   Temp: 36.9 C 36.8 C  SpO2: 99% 100%    Last Pain:  Vitals:   05/22/18 0867  TempSrc: Oral  PainSc:                  Ricki Miller

## 2018-05-22 NOTE — Plan of Care (Signed)
  Problem: Education: Goal: Knowledge of Childbirth will improve Outcome: Progressing Goal: Ability to make informed decisions regarding treatment and plan of care will improve Outcome: Progressing Goal: Ability to state and carry out methods to decrease the pain will improve Outcome: Progressing   Problem: Coping: Goal: Ability to verbalize concerns and feelings about labor and delivery will improve Outcome: Progressing   Problem: Life Cycle: Goal: Ability to make normal progression through stages of labor will improve Outcome: Progressing Goal: Ability to effectively push during vaginal delivery will improve Outcome: Progressing   Problem: Role Relationship: Goal: Ability to demonstrate positive interaction with the child will improve Outcome: Progressing   Problem: Safety: Goal: Risk of complications during labor and delivery will decrease Outcome: Progressing   Problem: Pain Management: Goal: Relief or control of pain from uterine contractions will improve Outcome: Progressing   Problem: Education: Goal: Knowledge of General Education information will improve Description Including pain rating scale, medication(s)/side effects and non-pharmacologic comfort measures Outcome: Progressing   Problem: Health Behavior/Discharge Planning: Goal: Ability to manage health-related needs will improve Outcome: Progressing   Problem: Clinical Measurements: Goal: Ability to maintain clinical measurements within normal limits will improve Outcome: Progressing Goal: Will remain free from infection Outcome: Progressing Goal: Diagnostic test results will improve Outcome: Progressing Goal: Respiratory complications will improve Outcome: Progressing Goal: Cardiovascular complication will be avoided Outcome: Progressing   Problem: Activity: Goal: Risk for activity intolerance will decrease Outcome: Progressing   Problem: Nutrition: Goal: Adequate nutrition will be  maintained Outcome: Progressing   Problem: Coping: Goal: Level of anxiety will decrease Outcome: Progressing   Problem: Elimination: Goal: Will not experience complications related to bowel motility Outcome: Progressing Goal: Will not experience complications related to urinary retention Outcome: Progressing   Problem: Pain Managment: Goal: General experience of comfort will improve Outcome: Progressing   Problem: Safety: Goal: Ability to remain free from injury will improve Outcome: Progressing   Problem: Skin Integrity: Goal: Risk for impaired skin integrity will decrease Outcome: Progressing   

## 2018-05-22 NOTE — Progress Notes (Signed)
Patient ID: Carrie Cameron, female   DOB: 08/17/1986, 32 y.o.   MRN: [id number]  Progress Note - Vaginal Delivery  Carrie Cameron is a 32 y.o. [id number] now PP day 1 s/p Vaginal, Spontaneous .   Subjective:  The patient reports no complaints, up ad lib, voiding and tolerating PO Desires tubal ligation.  Objective:  Vital signs in last 24 hours: Temp:  [98 F (36.7 C)-98.4 F (36.9 C)] 98 F (36.7 C) (08/02 0724) Pulse Rate:  [60-92] 65 (08/02 0724) Resp:  [18-20] 18 (08/02 0724) BP: (95-129)/(54-86) 107/77 (08/02 0724) SpO2:  [95 %-100 %] 100 % (08/02 0724) Weight:  [167 lb (75.8 kg)] 167 lb (75.8 kg) (08/01 2209)  Physical Exam:  General: alert, cooperative and no distress Lochia: appropriate Uterine Fundus: firm DVT Evaluation: No evidence of DVT seen on physical exam.    Data Review Recent Labs    05/21/18 2212  HGB 9.2*  HCT 28.2*    Assessment/Plan: Active Problems:   Normal labor   Contraception - tubal in AM   -- Continue routine PP care.     [name], M.D. 05/22/2018 10:04 AM

## 2018-05-22 NOTE — H&P (Signed)
History and Physical   HPI  Carrie Cameron is a 32 y.o. [id number] at 13w0dEstimated Date of Delivery: 06/12/18 who is being admitted for labor management. Grand multip.  H/O Macrosomia  OB History  OB History  Gravida Para Term Preterm AB Living  _0 0 4 5  SAB TAB Ectopic Multiple Live Births  4 0 0 0 5    # Outcome Date GA Lbr Len/2nd Weight Sex Delivery Anes PTL Lv  10 Current           9 SAB 07/21/16     SAB     8 Term 09/03/12 461w0d7 lb (3.175 kg) M Vag-Spont   LIV  7 SAB 2013 6w34w0d      6 Term 07/31/08 40w7w0d lb (4.536 kg) F    LIV  5 Term 01/27/07 40w031w0db (3.629 kg) F    LIV  4 SAB 2007 [redacted]w[redacted]d [redacted]w[redacted]d   3 Term 06/03/04 [redacted]w[redacted]d 449w0d(3.629 kg) M Vag-Spont   LIV  2 Term 11/25/02 [redacted]w[redacted]d  23w0d3.629 kg) M Vag-Spont   LIV     Complications: Postpartum hemorrhage  1 SAB      SAB       PROBLEM LIST  Pregnancy complications or risks: Patient Active Problem List   Diagnosis Date Noted  . Normal labor 05/21/2018  . Gestational diabetes mellitus (GDM), antepartum 02/11/2018  . Supervision of high-risk pregnancy 11/11/2017  . [place] 05/02/2017  . Overweight (BMI 25.0-29.9) 04/05/2017     Prenatal labs and studies: ABO, Rh: --/--/O POS (08/01 2212) Antibody: NEG (08/01 2212) Rubella: 1.59 (02/05 0835) RPR: Non Reactive (06/11 1406)  HBsAg: Negative (02/05 0835)  HIV: Non Reactive (06/11 1406)  GBS:NegaKGU:[id number])   Past Medical History:  Diagnosis Date  . Gestational diabetes   . Medical history non-contributory   . Pregnancy induced hypertension      Past Surgical History:  Procedure Laterality Date  . NO PAST SURGERIES       Medications    Current Discharge Medication List    CONTINUE these medications which have NOT CHANGED   Details  ACCU-CHEK FASTCLIX LANCETS MISC 1 Units by Percutaneous route 4 (four) times daily. Qty: 100 each, Refills: 12    acetaminophen (TYLENOL) 500 MG tablet Take 500 mg by mouth  every 6 (six) hours as needed.    aspirin EC 81 MG tablet Take 1 tablet (81 mg total) by mouth daily. Take for prevention of preeclampsia later in pregnancy Qty: 300 tablet, Refills: 2   Associated Diagnoses: Gestational diabetes mellitus (GDM), antepartum, gestational diabetes method of control unspecified    blood glucose meter kit and supplies KIT Dispense based on patient and insurance preference. Use up to four times daily as directed. (FOR ICD-9 250.00, 250.01). Qty: 1 each, Refills: 0    ferrous sulfate 325 (65 FE) MG EC tablet Take 325 mg by mouth 3 (three) times daily with meals.   Associated Diagnoses: Supervision of other normal pregnancy, antepartum    glucose blood test strip Use as instructed Qty: 100 each, Refills: 12    glyBURIDE (DIABETA) 2.5 MG tablet Take 2 tablets (5 mg total) by mouth at bedtime. Qty: 60 tablet, Refills: 3    metFORMIN (GLUCOPHAGE) 1000 MG tablet Take 1 tablet (1,000 mg total) by mouth 2 (two) times daily with a meal. Qty: 60  tablet, Refills: 2   Associated Diagnoses: Gestational diabetes mellitus (GDM) in second trimester controlled on oral hypoglycemic drug    Prenatal Vit-Fe Fumarate-FA (PRENATAL MULTIVITAMIN) TABS tablet Take 1 tablet by mouth daily at 12 noon.        Allergies  Patient has no known allergies.  Review of Systems  Pertinent items are noted in HPI.  Physical Exam  BP 108/72   Pulse 60   Temp 98.1 F (36.7 C) (Oral)   Resp 18   Ht 5' (1.524 m)   Wt 167 lb (75.8 kg)   LMP 09/05/2017   SpO2 100%   BMI 32.61 kg/m   Lungs:  CTA B Cardio: RRR without M/R/G Abd: Soft, gravid, NT Presentation: cephalic EXT: No C/C/ 1+ Edema DTRs: 2+ B CERVIX: 4-5 cm on admission   See Prenatal records for more detailed PE.     FHR:  Variability: Good {> 6 bpm)  Toco: Uterine Contractions: q2-31mn    Test Results  Results for orders placed or performed during the hospital encounter of 05/21/18 (from the past 24  hour(s))  Glucose, capillary     Status: None   Collection Time: 05/21/18  9:55 PM  Result Value Ref Range   Glucose-Capillary 77 70 - 99 mg/dL  CBC     Status: Abnormal   Collection Time: 05/21/18 10:12 PM  Result Value Ref Range   WBC 7.5 3.6 - 11.0 K/uL   RBC 3.80 3.80 - 5.20 MIL/uL   Hemoglobin 9.2 (L) 12.0 - 16.0 g/dL   HCT 28.2 (L) 35.0 - 47.0 %   MCV 74.3 (L) 80.0 - 100.0 fL   MCH 24.3 (L) 26.0 - 34.0 pg   MCHC 32.7 32.0 - 36.0 g/dL   RDW 17.0 (H) 11.5 - 14.5 %   Platelets 274 150 - 440 K/uL  Type and screen [place]    Status: None   Collection Time: 05/21/18 10:12 PM  Result Value Ref Range   ABO/RH(D) O POS    Antibody Screen NEG    Sample Expiration      05/24/2018 Performed at [facility] [address] [place] [id number]   Group B Strep negative  Assessment   [id number] 361w0dstimated Date of Delivery: 06/12/18  The fetus is reassuring.    Patient Active Problem List   Diagnosis Date Noted  . Normal labor 05/21/2018  . Gestational diabetes mellitus (GDM), antepartum 02/11/2018  . Supervision of high-risk pregnancy 11/11/2017  . [place] 05/02/2017  . Overweight (BMI 25.0-29.9) 04/05/2017    Plan  1. Admit to L&D :   expectant management 2. EFM: -- Category 1 3. Epidural if desired.  Stadol for IV pain until epidural requested. 4. Admission labs    DaFinis BudM.D. 05/22/2018 2:09 AM

## 2018-05-22 NOTE — Progress Notes (Signed)
Pt continues to complain of headache and neck pain despite medication. Pt reports it is worse when sitting. Dr Amie Critchley with anesthesia notified. No new orders received at this time. MD reported someone with anesthesia will come evaluate her at the bedside later today.

## 2018-05-23 ENCOUNTER — Inpatient Hospital Stay: Payer: BLUE CROSS/BLUE SHIELD | Admitting: Certified Registered"

## 2018-05-23 ENCOUNTER — Encounter: Payer: Self-pay | Admitting: Obstetrics and Gynecology

## 2018-05-23 ENCOUNTER — Encounter
Admission: EM | Disposition: A | Discharge status: Home / Self Care | Payer: Self-pay | Source: Home / Self Care | Attending: Obstetrics and Gynecology

## 2018-05-23 DIAGNOSIS — Z302 Encounter for sterilization: Secondary | ICD-10-CM

## 2018-05-23 HISTORY — PX: TUBAL LIGATION: SHX77

## 2018-05-23 LAB — RPR: RPR Ser Ql: NONREACTIVE

## 2018-05-23 SURGERY — LIGATION, FALLOPIAN TUBE, BILATERAL
Anesthesia: General

## 2018-05-23 MED ORDER — PROPOFOL 10 MG/ML IV BOLUS
INTRAVENOUS | Status: DC | PRN
  Administered 2018-05-23: 140 mg via INTRAVENOUS

## 2018-05-23 MED ORDER — IBUPROFEN 800 MG PO TABS: 800.0000 mg | ORAL_TABLET | Freq: Three times a day (TID) | ORAL | Status: DC

## 2018-05-23 MED ORDER — LIDOCAINE HCL (CARDIAC) PF 100 MG/5ML IV SOSY
PREFILLED_SYRINGE | INTRAVENOUS | Status: DC | PRN
  Administered 2018-05-23: 100 mg via INTRAVENOUS

## 2018-05-23 MED ORDER — BUPIVACAINE HCL 0.5 % IJ SOLN
INTRAMUSCULAR | Status: DC | PRN
  Administered 2018-05-23: 10 mL

## 2018-05-23 MED ORDER — FENTANYL CITRATE (PF) 100 MCG/2ML IJ SOLN
INTRAMUSCULAR | Status: AC
  Filled 2018-05-23: qty 2

## 2018-05-23 MED ORDER — IBUPROFEN 800 MG PO TABS
800.0000 mg | ORAL_TABLET | Freq: Three times a day (TID) | ORAL | Status: DC
  Administered 2018-05-23 (×2): 800 mg via ORAL
  Filled 2018-05-23 (×2): qty 1

## 2018-05-23 MED ORDER — DEXAMETHASONE SODIUM PHOSPHATE 10 MG/ML IJ SOLN
INTRAMUSCULAR | Status: AC
  Filled 2018-05-23: qty 1

## 2018-05-23 MED ORDER — SUGAMMADEX SODIUM 200 MG/2ML IV SOLN
INTRAVENOUS | Status: DC | PRN
  Administered 2018-05-23: 150 mg via INTRAVENOUS

## 2018-05-23 MED ORDER — FENTANYL CITRATE (PF) 100 MCG/2ML IJ SOLN
25.0000 ug | INTRAMUSCULAR | Status: DC | PRN
  Administered 2018-05-23 (×4): 25 ug via INTRAVENOUS

## 2018-05-23 MED ORDER — KETOROLAC TROMETHAMINE 30 MG/ML IJ SOLN
INTRAMUSCULAR | Status: AC
  Filled 2018-05-23: qty 1

## 2018-05-23 MED ORDER — DEXAMETHASONE SODIUM PHOSPHATE 10 MG/ML IJ SOLN
INTRAMUSCULAR | Status: DC | PRN
  Administered 2018-05-23: 10 mg via INTRAVENOUS

## 2018-05-23 MED ORDER — ONDANSETRON HCL 4 MG/2ML IJ SOLN: 4.0000 mg | Freq: Once | INTRAMUSCULAR | Status: DC | PRN

## 2018-05-23 MED ORDER — PROPOFOL 10 MG/ML IV BOLUS
INTRAVENOUS | Status: AC
  Filled 2018-05-23: qty 20

## 2018-05-23 MED ORDER — DOCUSATE SODIUM 100 MG PO CAPS: 100.0000 mg | ORAL_CAPSULE | Freq: Two times a day (BID) | ORAL | 2 refills | Status: DC

## 2018-05-23 MED ORDER — ONDANSETRON HCL 4 MG/2ML IJ SOLN
INTRAMUSCULAR | Status: AC
  Filled 2018-05-23: qty 2

## 2018-05-23 MED ORDER — ROCURONIUM BROMIDE 100 MG/10ML IV SOLN
INTRAVENOUS | Status: DC | PRN
  Administered 2018-05-23: 30 mg via INTRAVENOUS

## 2018-05-23 MED ORDER — EPHEDRINE SULFATE 50 MG/ML IJ SOLN
INTRAMUSCULAR | Status: DC | PRN
  Administered 2018-05-23: 10 mg via INTRAVENOUS

## 2018-05-23 MED ORDER — KETOROLAC TROMETHAMINE 30 MG/ML IJ SOLN
INTRAMUSCULAR | Status: DC | PRN
  Administered 2018-05-23: 30 mg via INTRAVENOUS

## 2018-05-23 MED ORDER — BUPIVACAINE HCL (PF) 0.5 % IJ SOLN
INTRAMUSCULAR | Status: AC
  Filled 2018-05-23: qty 30

## 2018-05-23 MED ORDER — LIDOCAINE HCL (PF) 2 % IJ SOLN
INTRAMUSCULAR | Status: AC
  Filled 2018-05-23: qty 10

## 2018-05-23 MED ORDER — EPHEDRINE SULFATE 50 MG/ML IJ SOLN
INTRAMUSCULAR | Status: AC
  Filled 2018-05-23: qty 1

## 2018-05-23 MED ORDER — FENTANYL CITRATE (PF) 100 MCG/2ML IJ SOLN
INTRAMUSCULAR | Status: DC | PRN
  Administered 2018-05-23: 100 ug via INTRAVENOUS

## 2018-05-23 MED ORDER — SUGAMMADEX SODIUM 200 MG/2ML IV SOLN
INTRAVENOUS | Status: AC
  Filled 2018-05-23: qty 2

## 2018-05-23 MED ORDER — ONDANSETRON HCL 4 MG/2ML IJ SOLN
INTRAMUSCULAR | Status: DC | PRN
  Administered 2018-05-23: 4 mg via INTRAVENOUS

## 2018-05-23 MED ORDER — HYDROCODONE-ACETAMINOPHEN 5-325 MG PO TABS: 1.0000 | ORAL_TABLET | Freq: Four times a day (QID) | ORAL | 0 refills | Status: DC | PRN

## 2018-05-23 MED ORDER — IBUPROFEN 800 MG PO TABS: 800.0000 mg | ORAL_TABLET | Freq: Three times a day (TID) | ORAL | 1 refills | Status: DC

## 2018-05-23 SURGICAL SUPPLY — 23 items
BLADE SURG SZ11 CARB STEEL (BLADE) ×2 IMPLANT
CHLORAPREP W/TINT 26ML (MISCELLANEOUS) ×2 IMPLANT
DERMABOND ADVANCED (GAUZE/BANDAGES/DRESSINGS) ×1
DERMABOND ADVANCED .7 DNX12 (GAUZE/BANDAGES/DRESSINGS) ×1 IMPLANT
DRAPE LAPAROTOMY 100X77 ABD (DRAPES) ×2 IMPLANT
GLOVE BIO SURGEON STRL SZ 6.5 (GLOVE) ×2 IMPLANT
GLOVE INDICATOR 7.0 STRL GRN (GLOVE) ×2 IMPLANT
GOWN STRL REUS W/ TWL LRG LVL3 (GOWN DISPOSABLE) ×2 IMPLANT
GOWN STRL REUS W/TWL LRG LVL3 (GOWN DISPOSABLE) ×2
KIT TURNOVER CYSTO (KITS) ×2 IMPLANT
NEEDLE HYPO 25GX1X1/2 BEV (NEEDLE) ×2 IMPLANT
NS IRRIG 500ML POUR BTL (IV SOLUTION) ×2 IMPLANT
PACK BASIN MINOR ARMC (MISCELLANEOUS) ×2 IMPLANT
SUT MNCRL 4-0 (SUTURE) ×1
SUT MNCRL 4-0 27XMFL (SUTURE) ×1
SUT PLAIN GUT 0 (SUTURE) ×4 IMPLANT
SUT VIC AB 0 CT1 36 (SUTURE) IMPLANT
SUT VIC AB 0 SH 27 (SUTURE) IMPLANT
SUT VIC AB 3-0 SH 27 (SUTURE) ×1
SUT VIC AB 3-0 SH 27X BRD (SUTURE) ×1 IMPLANT
SUT VICRYL 0 AB UR-6 (SUTURE) ×4 IMPLANT
SUTURE MNCRL 4-0 27XMF (SUTURE) ×1 IMPLANT
SYR 10ML LL (SYRINGE) ×2 IMPLANT

## 2018-05-23 NOTE — Progress Notes (Signed)
Pt c/o headache (forehead and base of head/neck). States that she had sever headache after epidural yesterday. MD notified, also to notify anesthesia.

## 2018-05-23 NOTE — Anesthesia Post-op Follow-up Note (Signed)
Anesthesia QCDR form completed.        

## 2018-05-23 NOTE — Anesthesia Procedure Notes (Signed)
Epidural Blood Patch  Start time: 05/23/2018 3:15 PM End time: 05/23/2018 3:23 PM  Staffing Performed: anesthesiologist   Preanesthetic Checklist Completed: patient identified, site marked, surgical consent, pre-op evaluation, timeout performed, IV checked, risks and benefits discussed and monitors and equipment checked  Epidural Patient position: sitting Prep: ChloraPrep Patient monitoring: heart rate, continuous pulse ox and blood pressure Approach: midline Location: L4-L5 Injection technique: LOR saline  Needle:  Needle type: Tuohy  Needle gauge: 18 G Needle length: 9 cm and 9 Needle insertion depth: 7 cm  Assessment Events: blood not aspirated, injection not painful, no injection resistance, negative IV test and no paresthesia  Additional Notes   Patient tolerated the insertion well without complications. 20 minutes following procedure HA was resolved. Instructions given: No lifting more than newborn. Continue increased fluid intake.Reason for block:Epidural Blood Patch

## 2018-05-23 NOTE — Discharge Summary (Signed)
  OB Discharge Summary     Patient Name: Carrie Cameron DOB: 01/07/1986 MRN: [id number]  Date of admission: 05/21/2018 Delivering MD: EVANS, DAVID JAMES   Date of discharge: 05/23/2018  Admitting diagnosis: pelvic pressure 37 weeks Intrauterine pregnancy: [redacted]w[redacted]d     Secondary diagnosis:  Active Problems:   Normal labor  Desires permanent sterilization  Anemia of pregnancy  Gestational diabetes (Class A2), oral medications  Additional problems: None     Discharge diagnosis: Term Pregnancy Delivered                                                                                                Post partum procedures:postpartum tubal ligation  Augmentation: AROM  Complications: None  Hospital course:  Onset of Labor With Vaginal Delivery     32 y.o. yo [id number] at [redacted]w[redacted]d was admitted in Active Labor on 05/21/2018. Patient had an uncomplicated labor course as follows:  Membrane Rupture Time/Date: 1:52 AM ,05/22/2018   Intrapartum Procedures: Episiotomy: None [1]                                         Lacerations:  None [1]  Patient had a delivery of a Viable infant. 05/22/2018  Information for the patient's newborn:  Carrie Cameron [[id number]]  Delivery Method: Vag-Spont    [name] had an uncomplicated postpartum course.  She is ambulating, tolerating a regular diet, passing flatus, and urinating well. Patient is discharged home in stable condition on 05/23/18.   Physical exam  Vitals:   05/23/18 0713 05/23/18 0826 05/23/18 0955 05/23/18 1145  BP: (!) 84/50 104/73 118/78 98/67  Pulse: 60 68 (!) 57 62  Resp: 18   20  Temp: 98.5 F (36.9 C)   98.6 F (37 C)  TempSrc: Oral   Oral  SpO2: 100%   99%  Weight:      Height:       General: alert and no distress Lochia: appropriate Uterine Fundus: firm Incision: Healing well with no significant drainage, No significant erythema DVT Evaluation: No evidence of DVT seen on physical exam. Negative Homan's sign. No  cords or calf tenderness. No significant calf/ankle edema. Labs: Lab Results  Component Value Date   WBC 7.5 05/21/2018   HGB 9.2 (L) 05/21/2018   HCT 28.2 (L) 05/21/2018   MCV 74.3 (L) 05/21/2018   PLT 274 05/21/2018    CMP Latest Ref Rng & Units 01/22/2016  Glucose 65 - 99 mg/dL 101(H)  BUN 6 - 20 mg/dL 10  Creatinine 0.44 - 1.00 mg/dL 0.52  Sodium 135 - 145 mmol/L 133(L)  Potassium 3.5 - 5.1 mmol/L 3.1(L)  Chloride 101 - 111 mmol/L 101  CO2 22 - 32 mmol/L 26  Calcium 8.9 - 10.3 mg/dL 9.2  Total Protein 6.5 - 8.1 g/dL 8.2(H)  Total Bilirubin 0.3 - 1.2 mg/dL 0.5  Alkaline Phos 38 - 126 U/L 60  AST 15 - 41 U/L 23  ALT 14 - 54 U/L 26      Discharge instruction: per After Visit Summary and "Baby and Me Booklet".  After visit meds:  Allergies as of 05/23/2018   No Known Allergies     Medication List    STOP taking these medications   ACCU-CHEK FASTCLIX LANCETS Misc   acetaminophen 500 MG tablet Commonly known as:  TYLENOL   aspirin EC 81 MG tablet   blood glucose meter kit and supplies Kit   glucose blood test strip   glyBURIDE 2.5 MG tablet Commonly known as:  DIABETA   metFORMIN 1000 MG tablet Commonly known as:  GLUCOPHAGE     TAKE these medications   docusate sodium 100 MG capsule Commonly known as:  COLACE Take 1 capsule (100 mg total) by mouth 2 (two) times daily.   ferrous sulfate 325 (65 FE) MG EC tablet Take 325 mg by mouth 3 (three) times daily with meals.   HYDROcodone-acetaminophen 5-325 MG tablet Commonly known as:  NORCO/VICODIN Take 1 tablet by mouth every 6 (six) hours as needed for moderate pain or severe pain.   ibuprofen 800 MG tablet Commonly known as:  ADVIL,MOTRIN Take 1 tablet (800 mg total) by mouth every 8 (eight) hours.   prenatal multivitamin Tabs tablet Take 1 tablet by mouth daily at 12 noon.       Diet: routine diet  Activity: Advance as tolerated. Pelvic rest for 6 weeks.   Outpatient follow up:6 weeks Follow up  Appt: Future Appointments  Date Time Provider [place]  05/25/2018  1:00 PM Anyanwu, Sallyanne Havers, MD [facility]  06/01/2018  1:00 PM Anyanwu, Sallyanne Havers, MD [facility]   Follow up Visit:No follow-ups on file.  Postpartum contraception: Tubal Ligation  Newborn Data: Live born female  Birth Weight: 6 lb 15.1 oz (3150 g) APGAR: 8, 9  Newborn Delivery   Birth date/time:  05/22/2018 01:55:00 Delivery type:  Vaginal, Spontaneous     Baby Feeding: Breast Disposition:home with mother   05/23/2018 Carrie Maid, MD

## 2018-05-23 NOTE — Plan of Care (Signed)
Patient's vital signs stable; fundus firm; small amount rubra lochia; voiding; good appetite and good po fluids until midnight and then NPO for surgery this a.m.;IV fluids continued; IV site clear; bottle feeding infant with good technique observed; good maternal-infant bonding observed; pain controlled with po motrin and po percocet; husband at bedside and attentive.

## 2018-05-23 NOTE — Transfer of Care (Signed)
Immediate Anesthesia Transfer of Care Note  Patient: Carrie Cameron  Procedure(s) Performed: BILATERAL TUBAL LIGATION (N/A )  Patient Location: PACU  Anesthesia Type:General  Level of Consciousness: sedated and drowsy  Airway & Oxygen Therapy: Patient Spontanous Breathing and Patient connected to nasal cannula oxygen  Post-op Assessment: Report given to RN and Post -op Vital signs reviewed and stable  Post vital signs: Reviewed and stable  Last Vitals:  Vitals Value Taken Time  BP 114/73 05/23/2018  2:35 PM  Temp    Pulse 70 05/23/2018  2:36 PM  Resp 17 05/23/2018  2:36 PM  SpO2 94 % 05/23/2018  2:36 PM  Vitals shown include unvalidated device data.  Last Pain:  Vitals:   05/23/18 1145  TempSrc: Oral  PainSc:          Complications: No apparent anesthesia complications

## 2018-05-23 NOTE — Consult Note (Signed)
Pt complaining of continued HA pain since epidural. No help with conservative measures. HA positional and 7/10 in severity. Originating in occiput and radiating to the frontal aspect. We will do the PPTL first and then proceed with EBP when pt recovered from anesthesia. Pt is aware of risks and benefits and desires to proceed.

## 2018-05-23 NOTE — Progress Notes (Signed)
Post Partum Day # 1, s/p SVD  Subjective: up ad lib, voiding and tolerating PO.  She does complain of feeling lightheaded when ambulating, also with headache on changing positions. Notes that headache was worse after epidural yesterday. Has been NPO since midnight for planned postpartum BTL.   Objective: Temp:  [98 F (36.7 C)-98.6 F (37 C)] 98.5 F (36.9 C) (08/03 0713) Pulse Rate:  [57-86] 57 (08/03 0955) Resp:  [18-20] 18 (08/03 0713) BP: (84-118)/(50-78) 118/78 (08/03 0955) SpO2:  [99 %-100 %] 100 % (08/03 0713)  Physical Exam:  General: alert and no distress  Lungs: clear to auscultation bilaterally Breasts: normal appearance, no masses or tenderness Heart: regular rate and rhythm, S1, S2 normal, no murmur, click, rub or gallop Abdomen: soft, non-tender; bowel sounds normal; no masses,  no organomegaly Pelvis: Lochia: appropriate, Uterine Fundus: firm Extremities: DVT Evaluation: No evidence of DVT seen on physical exam. Negative Homan's sign. No cords or calf tenderness. No significant calf/ankle edema.  Recent Labs    05/21/18 2212  HGB 9.2*  HCT 28.2*    Assessment/Plan: Anesthesia to evaluate post-procedure for epidural headache.  Breastfeeding and Contraception desires PP BTL, scheduled for today Declines circumcision for female infant.  Likely d/c home later today after BTL, patient's preference.    LOS: 2 days   Rubie Maid, MD [facility] 05/23/2018 10:38 AM

## 2018-05-23 NOTE — Progress Notes (Signed)
Anesthesia notified. Per Dr. Ronelle Nigh, no new orders at this time, continue fluids and monitor, will reassess pt in pre-op.

## 2018-05-23 NOTE — Anesthesia Procedure Notes (Signed)
Procedure Name: Intubation Date/Time: 05/23/2018 1:40 PM Performed by: Milus Height, CRNA Pre-anesthesia Checklist: Patient identified, Patient being monitored, Timeout performed, Emergency Drugs available and Suction available Patient Re-evaluated:Patient Re-evaluated prior to induction Oxygen Delivery Method: Circle system utilized Preoxygenation: Pre-oxygenation with 100% oxygen Induction Type: IV induction Ventilation: Mask ventilation without difficulty Laryngoscope Size: Mac and 3 Grade View: Grade I Tube type: Oral Tube size: 7.0 mm Number of attempts: 1 Airway Equipment and Method: Stylet Placement Confirmation: ETT inserted through vocal cords under direct vision,  positive ETCO2 and breath sounds checked- equal and bilateral Secured at: 20 cm Tube secured with: Tape Dental Injury: Teeth and Oropharynx as per pre-operative assessment

## 2018-05-23 NOTE — Anesthesia Postprocedure Evaluation (Signed)
Anesthesia Post Note  Patient: Carrie Cameron  Procedure(s) Performed: BILATERAL TUBAL LIGATION (N/A )  Patient location during evaluation: PACU Anesthesia Type: General Level of consciousness: awake and alert Pain management: pain level controlled Vital Signs Assessment: post-procedure vital signs reviewed and stable Respiratory status: spontaneous breathing and respiratory function stable Cardiovascular status: stable Anesthetic complications: no     Last Vitals:  Vitals:   05/23/18 1532 05/23/18 1537  BP:    Pulse: 63 66  Resp: 15 17  Temp:    SpO2: 93% 93%    Last Pain:  Vitals:   05/23/18 1537  TempSrc:   PainSc: 2                  [name]

## 2018-05-23 NOTE — Op Note (Signed)
Procedure(s): BILATERAL TUBAL LIGATION Procedure Note  Analyce Tavares female 32 y.o. 05/23/2018  Indications: The patient is a 32 y.o. [id number] female PPD#1 s/p NSVD, grand multiparity desiring permanent sterilization.    Pre-operative Diagnosis: postpartum s/p NSVD, grand multiparity desiring permanent sterilization.   Post-operative Diagnosis: Same  Surgeon: Rubie Maid, MD  Assistants:  Surgical scrub tech  Anesthesia: General endotracheal anesthesia  Procedure Details: The patient was seen in the Holding Room. The risks, benefits, complications, treatment options, and expected outcomes were discussed with the patient.  The patient concurred with the proposed plan, giving informed consent.  The site of surgery properly noted. The patient was taken to the Operating Room, identified as Carrie Cameron and the procedure verified as Procedure(s) (LRB): BILATERAL TUBAL LIGATION (N/A). A Time Out was held and the above information confirmed.  She was then placed under general anesthesia without difficulty. She was then placed in the dorsal supine position and prepped and draped in sterile fashion.  After an adequate timeout was performed, attention was turned to the patient's abdomen where a small transverse skin incision was made under the umbilical fold. Prior to incision the skin was injected with 5 cc of 0.5% Marcaine with epinephrine. The incision was taken down to the layer of fascia using the scalpel, and fascia was incised, and extended bilaterally using Mayo scissors. The peritoneum was entered in a sharp fashion. Attention was then turned to the patient's uterus, and left fallopian tube was identified and followed out to the fimbriated end.  The Babcock clamp was then used to grasp the tube approximately 4 cm from the cornual region.  A 3 cm segment of tube was then ligated with a free tie of 0-Chromic using the Parkland method and excised.  The right fallopian tube was  then ligated in a similar fashion and excised. The tubal lumens were cauterized bilaterally.  Good hemostasis was noted with bilateral fallopian tubes. The uterus was then returned to the abdomen.    Good hemostasis was noted overall.  The instruments were then removed from the patient's abdomen and the fascial incision was repaired with 0 Vicryl.  The fascia was then injected with 5 cc of 0.5% Marcaine with epinephrine.  The subcutaneous fat layer was closed with a 3-0 Vicryl in a running fashion, and the skin was closed with a 4-0 Vicryl subcuticular stitch. The patient tolerated the procedure well.  Instrument, sponge, and needle counts were correct times three.  The patient was then taken to the recovery room awake and in stable condition.   Findings: The fundal height was 1 cm below umbilicus.  Normal appearing fallopian tubes and ovaries bilaterally.  Estimated Blood Loss:  minimal      Drains: patient voided prior to procedure         Total IV Fluids:  200 ml  Specimens: Bilateral segments of fallopian tubes         Implants: None         Complications:  None; patient tolerated the procedure well.         Disposition: PACU - hemodynamically stable.         Condition: stable   Rubie Maid, MD [facility]

## 2018-05-23 NOTE — Progress Notes (Signed)
Dr. Ronelle Nigh in to talk with patient for bloodpatch Procedure.

## 2018-05-23 NOTE — Progress Notes (Signed)
15 minute call to floor. 

## 2018-05-23 NOTE — Anesthesia Preprocedure Evaluation (Signed)
Anesthesia Evaluation  Patient identified by MRN, date of birth, ID band Patient awake    Reviewed: Allergy & Precautions, NPO status , Patient's Chart, lab work & pertinent test results  History of Anesthesia Complications Negative for: history of anesthetic complications  Airway Mallampati: II       Dental   Pulmonary neg sleep apnea, neg COPD,           Cardiovascular hypertension, (-) Past MI and (-) CHF (-) dysrhythmias (-) Valvular Problems/Murmurs     Neuro/Psych neg Seizures    GI/Hepatic Neg liver ROS, neg GERD  ,  Endo/Other  diabetes, Gestational  Renal/GU negative Renal ROS     Musculoskeletal   Abdominal   Peds  Hematology   Anesthesia Other Findings   Reproductive/Obstetrics                             Anesthesia Physical Anesthesia Plan  ASA: II and emergent  Anesthesia Plan: General   Post-op Pain Management:    Induction:   PONV Risk Score and Plan: 3 and Dexamethasone, Ondansetron and Midazolam  Airway Management Planned: Oral ETT  Additional Equipment:   Intra-op Plan:   Post-operative Plan:   Informed Consent: I have reviewed the patients History and Physical, chart, labs and discussed the procedure including the risks, benefits and alternatives for the proposed anesthesia with the patient or authorized representative who has indicated his/her understanding and acceptance.     Plan Discussed with:   Anesthesia Plan Comments:         Anesthesia Quick Evaluation

## 2018-05-23 NOTE — Progress Notes (Signed)
Dr. Ronelle Nigh at bedside, sat patient upright 90 degrees.  Patient tolerated procedure well.  Patient denies headache at this time.

## 2018-05-23 NOTE — Progress Notes (Signed)
Reviewed D/C instructions with pt and family. Pt verbalized understanding of teaching. Pt reports pain well controlled, ate snack with meds, denies nausea. Discharged to home via W/C. Pt to schedule f/u appt.

## 2018-05-24 ENCOUNTER — Encounter: Payer: Self-pay | Admitting: Obstetrics and Gynecology

## 2018-05-25 ENCOUNTER — Other Ambulatory Visit: Payer: Self-pay

## 2018-05-25 ENCOUNTER — Encounter: Payer: Self-pay | Admitting: Emergency Medicine

## 2018-05-25 ENCOUNTER — Encounter: Payer: BLUE CROSS/BLUE SHIELD | Admitting: Obstetrics & Gynecology

## 2018-05-25 ENCOUNTER — Emergency Department
Admission: EM | Admit: 2018-05-25 | Discharge: 2018-05-26 | Disposition: A | Discharge status: Home / Self Care | Payer: BLUE CROSS/BLUE SHIELD | Attending: Emergency Medicine | Admitting: Emergency Medicine

## 2018-05-25 DIAGNOSIS — Z79899 Other long term (current) drug therapy: Secondary | ICD-10-CM | POA: Diagnosis not present

## 2018-05-25 DIAGNOSIS — O894 Spinal and epidural anesthesia-induced headache during the puerperium: Secondary | ICD-10-CM | POA: Diagnosis not present

## 2018-05-25 MED ORDER — BUTALBITAL-APAP-CAFFEINE 50-325-40 MG PO TABS
2.0000 | ORAL_TABLET | Freq: Once | ORAL | Status: AC
  Administered 2018-05-25: 2 via ORAL
  Filled 2018-05-25: qty 2

## 2018-05-25 NOTE — ED Triage Notes (Signed)
Pt had epidural on Friday and today has had HA. Pt has had blood patch placed on Saturday. Pt d/c on Saturday as well.

## 2018-05-25 NOTE — ED Provider Notes (Signed)
[facility] Emergency Department Provider Note ____________________________________________  Time seen: Approximately 10:57 PM  I have reviewed the triage vital signs and the nursing notes.   HISTORY  Chief Complaint Post-op Problem   HPI Carrie Cameron is a 32 y.o. female who presents to the emergency department for treatment and evaluation of headache post epidural.  She had a blood patch performed on Saturday which provided some relief.  She was discharged home with Norco which has not provided her any relief at all.   Location: Diffuse Similar to previous headaches: Yes Duration: Post epidural TIMING: Upon standing SEVERITY: 7/10 QUALITY: Ache and throb CONTEXT: Post epidural MODIFYING FACTORS: Lying helps ASSOCIATED SYMPTOMS: None Past Medical History:  Diagnosis Date  . Gestational diabetes   . Medical history non-contributory   . Pregnancy induced hypertension     Patient Active Problem List   Diagnosis Date Noted  . Gestational diabetes mellitus (GDM), antepartum 02/11/2018  . Supervision of high-risk pregnancy 11/11/2017  . 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 Overweight (BMI 25.0-29.9) 04/05/2017    Past Surgical History:  Procedure Laterality Date  . NO PAST SURGERIES    . TUBAL LIGATION N/A 05/23/2018   Procedure: BILATERAL TUBAL LIGATION;  Surgeon: Rubie Maid, MD;  Location: [facility] ORS;  Service: Gynecology;  Laterality: N/A;    Prior to Admission medications   Medication Sig Start Date End Date Taking? Authorizing Provider  docusate sodium (COLACE) 100 MG capsule Take 1 capsule (100 mg total) by mouth 2 (two) times daily. 05/23/18  Yes Rubie Maid, MD  ferrous sulfate 325 (65 FE) MG EC tablet Take 325 mg by mouth 3 (three) times daily with meals.   Yes [provider]  HYDROcodone-acetaminophen (NORCO/VICODIN) 5-325 MG tablet Take 1 tablet by mouth every 6 (six) hours as needed for moderate pain or severe pain. 05/23/18   Yes Rubie Maid, MD  ibuprofen (ADVIL,MOTRIN) 800 MG tablet Take 1 tablet (800 mg total) by mouth every 8 (eight) hours. 05/23/18  Yes Rubie Maid, MD  Prenatal Vit-Fe Fumarate-FA (PRENATAL MULTIVITAMIN) TABS tablet Take 1 tablet by mouth daily at 12 noon.   Yes [provider]  butalbital-acetaminophen-caffeine (FIORICET, ESGIC) 50-325-40 MG tablet Take 1 tablet by mouth every 6 (six) hours as needed for headache. 05/26/18 05/26/19  Victorino Dike, FNP    Allergies Patient has no known allergies.  Family History  Problem Relation Age of Onset  . Diabetes Maternal Uncle     Social History Social History   Tobacco Use  . Smoking status: Never Smoker  . Smokeless tobacco: Never Used  Substance Use Topics  . Alcohol use: No  . Drug use: No    Review of Systems Constitutional: No fever/chills or recent injury. Eyes: No visual changes. ENT: No sore throat. Respiratory: Denies shortness of breath. Gastrointestinal: No abdominal pain.  No nausea, no vomiting.  No diarrhea.  No constipation. Musculoskeletal: Negative for pain. Skin: Negative for rash. Neurological:Positive for headache, negative for focal weakness or numbness. No confusion or fainting. ___________________________________________   PHYSICAL EXAM:  VITAL SIGNS: ED Triage Vitals  Enc Vitals Group     BP 05/25/18 2029 140/85     Pulse Rate 05/25/18 2029 80     Resp 05/25/18 2029 17     Temp 05/25/18 2029 98.3 F (36.8 C)     Temp Source 05/25/18 2029 Oral     SpO2 05/25/18 2029 100 %     Weight --  Height --      Head Circumference --      Peak Flow --      Pain Score 05/25/18 2030 8     Pain Loc --      Pain Edu? --      Excl. in [place]? --     Constitutional: Alert and oriented. Well appearing and in no acute distress. Eyes: Conjunctivae are normal. PERRL. EOMI without expressed pain. No evidence of papilledema on limited exam. Head: Atraumatic. Nose: No  congestion/rhinnorhea. Mouth/Throat: Mucous membranes are moist.  Oropharynx non-erythematous. Neck: No stridor. Supple, no meningismus.  Cardiovascular: Normal rate, regular rhythm. Grossly normal heart sounds.  Good peripheral circulation. Respiratory: Normal respiratory effort.  No retractions. Lungs CTAB. Gastrointestinal: Soft and nontender. No distention.  Musculoskeletal: No lower extremity tenderness nor edema.  No joint effusions. Neurologic:  Normal speech and language. No gross focal neurologic deficits are appreciated. No gait instability. Cranial nerves: 2-10 normal as tested. Cerebellar:Normal Romberg, finger-nose-finger, heel to shin, normal gait. Sensorimotor: No aphasia, pronator drift, clonus, sensory loss or abnormal reflexes.  Skin:  Skin is warm, dry and intact. No rash noted. Psychiatric: Mood and affect are normal. Speech and behavior are normal. Normal thought process and cognition.  ____________________________________________   LABS (all labs ordered are listed, but only abnormal results are displayed)  Labs Reviewed - No data to display ____________________________________________  EKG  Not indicated. ____________________________________________  RADIOLOGY  No results found. ____________________________________________   PROCEDURES  Procedure(s) performed:  Procedures  Critical Care performed: None ____________________________________________   INITIAL IMPRESSION / ASSESSMENT AND PLAN / ED COURSE  32 year old female presenting to the emergency department for treatment and evaluation of headache.  She had some relief of this headache after a blood patch was performed on Saturday, but the pain returned.  She states that she believes that she was too active upon return home.  She has taken Norco and ibuprofen without relief.  She is not breast-feeding.  Headache today is unchanged from previous.  Advised the patient that we would not perform a blood  patch tonight.  She has not been treated with any caffeine-containing medications and therefore Fioricet has been requested.  ----------------------------------------- 12:50 AM on 05/26/2018 -----------------------------------------  Fioricet has provided relief.  Patient will be discharged home with a prescription for the same.  She was also given the number for anesthesiology to discuss a blood patch and is to call tomorrow to schedule an appointment if she continues to have a headache.  Patient is agreeable to this plan.  Pertinent labs & imaging results that were available during my care of the patient were reviewed by me and considered in my medical decision making (see chart for details). ____________________________________________   FINAL CLINICAL IMPRESSION(S) / ED DIAGNOSES  Final diagnoses:  Postpartum spinal headache    ED Discharge Orders        Ordered    butalbital-acetaminophen-caffeine (FIORICET, ESGIC) 50-325-40 MG tablet  Every 6 hours PRN     05/26/18 0030        Victorino Dike, FNP 05/26/18 3254    Nance Pear, MD 05/28/18 1606

## 2018-05-26 LAB — SURGICAL PATHOLOGY

## 2018-05-26 MED ORDER — BUTALBITAL-APAP-CAFFEINE 50-325-40 MG PO TABS: 1.0000 | ORAL_TABLET | Freq: Four times a day (QID) | ORAL | 0 refills | Status: AC | PRN

## 2018-05-26 NOTE — Discharge Instructions (Signed)
Please call and schedule an appointment with anesthesiology. Take the Fioricet as prescribed.

## 2018-05-27 ENCOUNTER — Encounter (HOSPITAL_COMMUNITY): Payer: Self-pay | Admitting: Anesthesiology

## 2018-05-27 ENCOUNTER — Inpatient Hospital Stay (HOSPITAL_COMMUNITY)
Admission: AD | Admit: 2018-05-27 | Discharge: 2018-05-27 | Disposition: A | Discharge status: Home / Self Care | Payer: BLUE CROSS/BLUE SHIELD | Source: Ambulatory Visit | Attending: Obstetrics & Gynecology | Admitting: Obstetrics & Gynecology

## 2018-05-27 ENCOUNTER — Encounter (HOSPITAL_COMMUNITY): Payer: Self-pay | Admitting: *Deleted

## 2018-05-27 DIAGNOSIS — R51 Headache: Secondary | ICD-10-CM | POA: Insufficient documentation

## 2018-05-27 DIAGNOSIS — G971 Other reaction to spinal and lumbar puncture: Secondary | ICD-10-CM | POA: Diagnosis not present

## 2018-05-27 DIAGNOSIS — Y844 Aspiration of fluid as the cause of abnormal reaction of the patient, or of later complication, without mention of misadventure at the time of the procedure: Secondary | ICD-10-CM | POA: Diagnosis not present

## 2018-05-27 DIAGNOSIS — Z79891 Long term (current) use of opiate analgesic: Secondary | ICD-10-CM | POA: Diagnosis not present

## 2018-05-27 DIAGNOSIS — Z833 Family history of diabetes mellitus: Secondary | ICD-10-CM | POA: Insufficient documentation

## 2018-05-27 DIAGNOSIS — Z9851 Tubal ligation status: Secondary | ICD-10-CM | POA: Insufficient documentation

## 2018-05-27 DIAGNOSIS — Z79899 Other long term (current) drug therapy: Secondary | ICD-10-CM | POA: Diagnosis not present

## 2018-05-27 DIAGNOSIS — O894 Spinal and epidural anesthesia-induced headache during the puerperium: Secondary | ICD-10-CM

## 2018-05-27 MED ORDER — CAFFEINE-SODIUM BENZOATE 125-125 MG/ML IJ SOLN: 250.0000 mg | Freq: Once | INTRAMUSCULAR | Status: DC

## 2018-05-27 MED ORDER — KETOROLAC TROMETHAMINE 30 MG/ML IJ SOLN
30.0000 mg | Freq: Once | INTRAMUSCULAR | Status: AC
  Administered 2018-05-27: 30 mg via INTRAVENOUS
  Filled 2018-05-27: qty 1

## 2018-05-27 MED ORDER — CAFFEINE-SODIUM BENZOATE 125-125 MG/ML IJ SOLN
500.0000 mg | Freq: Once | INTRAMUSCULAR | Status: AC
  Administered 2018-05-27: 500 mg via INTRAVENOUS
  Filled 2018-05-27: qty 2

## 2018-05-27 MED ORDER — LACTATED RINGERS IV SOLN
INTRAVENOUS | Status: DC
  Administered 2018-05-27 (×2): via INTRAVENOUS

## 2018-05-27 NOTE — Progress Notes (Signed)
Dr. Gifford Shave into see pt & eval c/o H/A s/p epidural last Friday.

## 2018-05-27 NOTE — MAU Note (Signed)
Pt s/p svd 08/02, had blood patch prior to discharge but headache returned yesterday.

## 2018-05-27 NOTE — Progress Notes (Signed)
Dr. Gifford Shave notified regarding pt's increased BP.  Will be into see pt in 45 minutes for possible blood patch.  RN to observe for now

## 2018-05-27 NOTE — Progress Notes (Signed)
CC: Headache s/p Epidural placement 05/21/18  HPI: Patient is 33yo Hispanic female with PMH of GDM and PIH who delivered via SVD on 05/22/17 at an OSH.  The patient went on to develop a headache that was positional in nature which failed to improve with conservative treatments.  On 05/23/18, the patient underwent a BTL under GA and then received an epidural blood patch which resulted in improvement in the headache symptoms.  The patient was discharged home but began to develop further headaches on 8/5.  She presented to OSH ED where she was told that they would not perform another epidural blood patch.  The patient was discharged home.  The patient presented to [facility] MAU on 05/27/18 with the same complaint of a positional headache not improved with PO meds, PO caffeine intake, and aggressive PO hydration.  The patient denies neck stiffness, fever, nausea, vomiting.  The patient does state that her initial labor epidural took three attempts for successful placement.  PE: LKG:[id number] female laying supine with lights off in exam room. CV: RRR Pulm: CTAB Abd: soft, non-tender  Assessment/Plan: 32 yo Hispanic female with presumed post-dural puncture headache unresolved after 1 epidural blood patch and PO meds.  I discussed the various treatment options for a post-dural puncture headache including doing nothing, IV hydration, PO meds, IV meds, and a repeat epidural blood patch.  I discussed that PDPH tend to resolve with no treatment.  I discussed the risks and benefits of each treatment plan.  I answered the patient's questions relating to treatment.  At this time, the patient wishes to try IV hydration, IV caffeine, IV toradol, and a dose of neostigmine and atropine which has been shown in a limited trial to reduce VAS scores in patient's suffering from PDPH.    -We will give the dose of IV neostigmine (17mcg/kg) and IV Atropine (64mcg/kg) with monitors applied in MAU.  -Recheck patient in four hours  for potential repeat dose. -If patient still not experiencing relief, will discuss repeat epidural blood patch.  Hoy Morn, MD

## 2018-05-27 NOTE — Progress Notes (Signed)
Dr. Gifford Shave into eval pt H/A.  IV meds given per MD.  Will reevaluate @ 9611, if H/A persist will perform blood patch.  Ptr verbalized understanding and agreeable

## 2018-05-27 NOTE — MAU Provider Note (Signed)
History    CSN: [id number]  Arrival date and time: 05/27/18 3428   First Provider Initiated Contact with Patient 05/27/18 1031       Chief Complaint  Patient presents with  . Headache   Carrie Cameron is a 32 y.o. G10P6 presenting to MAU with complaints of headache. She is PPD#5 after vaginal delivery on 8/2. She reports headache that has been occurring since Saturday. She states she was seen at [facility] where she delivered on Saturday and anesthesia performed a blood patch- she reports that it helped slightly and was discharged with Norco for pain management. She reports pain worsens when she sits up and stands up, she reports pain is relieved when she is laying down and she just feels pressure. She rates pain 10/10 when she sits up and 1/10 when she is lying down. She has taken the Norco prescribed prior to arrival to MAU with no relief of pain. She denies complications with pregnancy, denies hypertension during pregnancy or delivery.     OB History    Gravida  10   Para  6   Term  6   Preterm  0   AB  4   Living  6     SAB  4   TAB  0   Ectopic  0   Multiple  0   Live Births  6           Past Medical History:  Diagnosis Date  . Gestational diabetes   . Medical history non-contributory   . Pregnancy induced hypertension     Past Surgical History:  Procedure Laterality Date  . NO PAST SURGERIES    . TUBAL LIGATION N/A 05/23/2018   Procedure: BILATERAL TUBAL LIGATION;  Surgeon: Rubie Maid, MD;  Location: [facility] ORS;  Service: Gynecology;  Laterality: N/A;    Family History  Problem Relation Age of Onset  . Diabetes Maternal Uncle     Social History   Tobacco Use  . Smoking status: Never Smoker  . Smokeless tobacco: Never Used  Substance Use Topics  . Alcohol use: No  . Drug use: No    Allergies: No Known Allergies  Medications Prior to Admission  Medication Sig Dispense Refill Last Dose  . butalbital-acetaminophen-caffeine (FIORICET,  ESGIC) 50-325-40 MG tablet Take 1 tablet by mouth every 6 (six) hours as needed for headache. 20 tablet 0   . docusate sodium (COLACE) 100 MG capsule Take 1 capsule (100 mg total) by mouth 2 (two) times daily. 30 capsule 2 Past Week at Unknown time  . ferrous sulfate 325 (65 FE) MG EC tablet Take 325 mg by mouth 3 (three) times daily with meals.   Past Month at Unknown time  . HYDROcodone-acetaminophen (NORCO/VICODIN) 5-325 MG tablet Take 1 tablet by mouth every 6 (six) hours as needed for moderate pain or severe pain. 10 tablet 0 Past Week at Unknown time  . ibuprofen (ADVIL,MOTRIN) 800 MG tablet Take 1 tablet (800 mg total) by mouth every 8 (eight) hours. 60 tablet 1 Past Week at Unknown time  . Prenatal Vit-Fe Fumarate-FA (PRENATAL MULTIVITAMIN) TABS tablet Take 1 tablet by mouth daily at 12 noon.   Past Month at Unknown time    Review of Systems  Respiratory: Negative.   Cardiovascular: Negative.   Gastrointestinal: Negative.   Genitourinary: Negative.   Neurological: Positive for headaches. Negative for dizziness, seizures, syncope, weakness and light-headedness.   Physical Exam   Blood pressure 111/84, pulse 77, temperature 98.4 F (  36.9 C), temperature source Oral, resp. rate 18, height 5' (1.524 m), weight 158 lb (71.7 kg), SpO2 98 %, unknown if currently breastfeeding.  Physical Exam  Constitutional: She is oriented to person, place, and time. She appears well-developed and well-nourished.  Cardiovascular: Normal rate, regular rhythm and normal heart sounds.  Respiratory: Breath sounds normal. No respiratory distress. She has no wheezes. She has no rales.  GI: Soft. Bowel sounds are normal. She exhibits no distension. There is no tenderness.  Musculoskeletal: Normal range of motion.  Neurological: She is alert and oriented to person, place, and time.  Skin: Skin is warm and dry.  Psychiatric: She has a normal mood and affect. Her behavior is normal.   MAU Course   Procedures  MDM Medical screening complete  Anesthesia called to bedside to assess for spinal headache and blood patch   Assessment and Plan  1. Spinal Headache, postpartum   Care transferred to Anesthesia to management  Lajean Manes 05/27/2018, 11:47 AM

## 2018-05-27 NOTE — Progress Notes (Signed)
Dr. Gifford Shave, anesthesiologist, notified and requested to eval pt for possible spinal H/A

## 2018-05-29 ENCOUNTER — Other Ambulatory Visit (HOSPITAL_COMMUNITY): Payer: BLUE CROSS/BLUE SHIELD

## 2018-06-01 ENCOUNTER — Encounter: Payer: BLUE CROSS/BLUE SHIELD | Admitting: Obstetrics & Gynecology

## 2018-06-05 ENCOUNTER — Ambulatory Visit (HOSPITAL_COMMUNITY): Payer: BLUE CROSS/BLUE SHIELD

## 2018-06-08 ENCOUNTER — Encounter: Payer: BLUE CROSS/BLUE SHIELD | Admitting: Family Medicine

## 2018-06-08 ENCOUNTER — Ambulatory Visit: Payer: BLUE CROSS/BLUE SHIELD

## 2018-06-12 ENCOUNTER — Ambulatory Visit (HOSPITAL_COMMUNITY): Payer: BLUE CROSS/BLUE SHIELD

## 2018-06-19 ENCOUNTER — Ambulatory Visit (HOSPITAL_COMMUNITY): Payer: BLUE CROSS/BLUE SHIELD

## 2018-07-06 NOTE — Progress Notes (Signed)
Post Partum Exam  Carrie Cameron is a 32 y.o. [phone] female who presents for a postpartum visit. She is 6 weeks postpartum following a spontaneous vaginal delivery. I have fully reviewed the prenatal and intrapartum course. The delivery was at 90 gestational weeks.  Anesthesia: epidural. Postpartum course has been unremarkable. Baby's course has been unremarkable. Baby is feeding by bottle - Similac Sensitive RS. Bleeding no bleeding. Bowel function is normal. Bladder function is normal. Patient is sexually active. Contraception method is tubal ligation. Postpartum depression screening:neg  The following portions of the patient's history were reviewed and updated as appropriate: allergies, current medications, past family history, past medical history, past social history, past surgical history and problem list. Last pap smear done 11/2016 and was Normal  Review of Systems Pertinent items noted in HPI and remainder of comprehensive ROS otherwise negative.    Objective:  Blood pressure 114/76, pulse (!) 57, resp. rate 16, height 5' (1.524 m), weight 141 lb 12.8 oz (64.3 kg), not currently breastfeeding.  General:  alert, cooperative and appears stated age  Lungs: normal effort  Heart:  regular rate and rhythm  Abdomen: soft, non-tender; bowel sounds normal; no masses,  no organomegaly        Assessment:    Nml postpartum exam. Pap smear not done at today's visit.   Plan:   1. Contraception: tubal ligation 2. Pap due 2021 3. 2 hour OGT needed 4. Follow up in: 3 months or as needed.

## 2018-07-07 ENCOUNTER — Ambulatory Visit (INDEPENDENT_AMBULATORY_CARE_PROVIDER_SITE_OTHER): Payer: BLUE CROSS/BLUE SHIELD | Admitting: Family Medicine

## 2018-07-07 ENCOUNTER — Encounter: Payer: Self-pay | Admitting: Family Medicine

## 2018-07-07 DIAGNOSIS — Z8632 Personal history of gestational diabetes: Secondary | ICD-10-CM

## 2018-07-07 DIAGNOSIS — Z1389 Encounter for screening for other disorder: Secondary | ICD-10-CM | POA: Diagnosis not present

## 2018-07-17 ENCOUNTER — Other Ambulatory Visit: Payer: BLUE CROSS/BLUE SHIELD

## 2018-07-17 DIAGNOSIS — Z8632 Personal history of gestational diabetes: Secondary | ICD-10-CM

## 2018-07-18 LAB — GLUCOSE TOLERANCE, 2 HOURS
Glucose, 2 hour: 79 mg/dL (ref 65–139)
Glucose, GTT - Fasting: 100 mg/dL — ABNORMAL HIGH (ref 65–99)

## 2018-07-26 NOTE — Anesthesia Preprocedure Evaluation (Deleted)

## 2018-08-31 ENCOUNTER — Other Ambulatory Visit (INDEPENDENT_AMBULATORY_CARE_PROVIDER_SITE_OTHER): Payer: BLUE CROSS/BLUE SHIELD

## 2018-08-31 DIAGNOSIS — N760 Acute vaginitis: Secondary | ICD-10-CM | POA: Diagnosis not present

## 2018-08-31 DIAGNOSIS — Z113 Encounter for screening for infections with a predominantly sexual mode of transmission: Secondary | ICD-10-CM

## 2018-08-31 DIAGNOSIS — B9689 Other specified bacterial agents as the cause of diseases classified elsewhere: Secondary | ICD-10-CM | POA: Diagnosis not present

## 2018-08-31 DIAGNOSIS — N898 Other specified noninflammatory disorders of vagina: Secondary | ICD-10-CM | POA: Diagnosis not present

## 2018-08-31 LAB — POCT URINE QUALITATIVE DIPSTICK BLOOD: Blood, UA: NEGATIVE

## 2018-09-01 LAB — URINE CULTURE: Organism ID, Bacteria: NO GROWTH

## 2018-09-02 LAB — CERVICOVAGINAL ANCILLARY ONLY
Bacterial vaginitis: POSITIVE — AB
Candida vaginitis: NEGATIVE
Chlamydia: NEGATIVE
Neisseria Gonorrhea: NEGATIVE
Trichomonas: NEGATIVE

## 2018-09-03 MED ORDER — METRONIDAZOLE 500 MG PO TABS: 500.0000 mg | ORAL_TABLET | Freq: Two times a day (BID) | ORAL | 0 refills | Status: DC

## 2018-09-03 NOTE — Addendum Note (Signed)
Addended by: Verita Schneiders A on: 09/03/2018 12:12 PM   Modules accepted: Orders

## 2019-03-17 ENCOUNTER — Ambulatory Visit (INDEPENDENT_AMBULATORY_CARE_PROVIDER_SITE_OTHER): Payer: BLUE CROSS/BLUE SHIELD

## 2019-03-17 ENCOUNTER — Other Ambulatory Visit: Payer: Self-pay

## 2019-03-17 VITALS — BP 117/77 | HR 76 | Ht 60.0 in | Wt 135.6 lb

## 2019-03-17 DIAGNOSIS — R399 Unspecified symptoms and signs involving the genitourinary system: Secondary | ICD-10-CM | POA: Diagnosis not present

## 2019-03-17 LAB — POCT URINALYSIS DIPSTICK
Bilirubin, UA: NEGATIVE
Glucose, UA: NEGATIVE
Ketones, UA: NEGATIVE
Nitrite, UA: NEGATIVE
Protein, UA: POSITIVE — AB
Spec Grav, UA: 1.015 (ref 1.010–1.025)
pH, UA: 7.5 (ref 5.0–8.0)

## 2019-03-17 MED ORDER — SULFAMETHOXAZOLE-TRIMETHOPRIM 400-80 MG PO TABS: 1.0000 | ORAL_TABLET | Freq: Two times a day (BID) | ORAL | 0 refills | Status: DC

## 2019-03-17 NOTE — Progress Notes (Signed)
Pt states that she has been having burning, bleeding, and frequent urination x 1 day.Urine culture sent to the lab.  chiquita l wilson, CMA

## 2019-03-17 NOTE — Progress Notes (Signed)
BP 117/77   Pulse 76   Ht 5' (1.524 m)   Wt 135 lb 9.6 oz (61.5 kg)   LMP 02/19/2019   BMI 26.48 kg/m    Patient with classic UTI sx and blood clot on UA. Consulted by CMA and feel this is likely hemorrhagic cystitis. Will treat empirically with Bactrim while culture results.  Patient was non-toxic appearing.   Attestation of Attending Supervision of clinical support staff: I agree with the care provided to this patient and was available for any consultation.  I have reviewed the CMA's note and chart, and I agree with the management and plan.  Caren Macadam, MD, MPH, ABFM Attending Centre for [facility]

## 2019-03-19 LAB — URINE CULTURE

## 2019-03-24 ENCOUNTER — Telehealth: Payer: Self-pay | Admitting: *Deleted

## 2019-03-24 MED ORDER — FLUCONAZOLE 150 MG PO TABS: 150.0000 mg | ORAL_TABLET | Freq: Once | ORAL | 1 refills | Status: AC

## 2019-03-24 NOTE — Telephone Encounter (Signed)
Left message for pt to call back in regards to her results.

## 2019-03-24 NOTE — Telephone Encounter (Signed)
-----   Message from Caren Macadam, MD sent at 03/19/2019 10:44 AM EDT ----- Consistent with UTI sx. Patient was given rx for bactrim at visit. Call patient to let her know to continue antibiotic - I have sent  mychart message as well.

## 2019-03-24 NOTE — Telephone Encounter (Signed)
Pt informed of results and to keep taking antibiotic. Pt requested diflucan to be sent in due to her getting yeast infections with antibiotic use. Will send in RX

## 2019-05-26 ENCOUNTER — Encounter: Payer: Self-pay | Admitting: Advanced Practice Midwife

## 2019-05-26 ENCOUNTER — Telehealth (INDEPENDENT_AMBULATORY_CARE_PROVIDER_SITE_OTHER): Payer: Self-pay | Admitting: Advanced Practice Midwife

## 2019-05-26 ENCOUNTER — Other Ambulatory Visit: Payer: Self-pay

## 2019-05-26 DIAGNOSIS — O925 Suppressed lactation: Secondary | ICD-10-CM

## 2019-05-26 NOTE — Progress Notes (Signed)
   TELEHEALTH VIRTUAL GYNECOLOGY PROBLEM NOTE  I connected with Carrie Cameron on 05/26/19 at  9:00 AM EDT by telephone at home and verified that I am speaking with the correct person using two identifiers.   I discussed the limitations, risks, security and privacy concerns of performing an evaluation and management service by telephone and the availability of in person appointments. I also discussed with the patient that there may be a patient responsible charge related to this service. The patient expressed understanding and agreed to proceed.   History:  Carrie Cameron is a 33 y.o. [phone] female being evaluated today for leaking of breastmilk. She is s/p SVD and BTL on 05/23/2018. She reports that she has continued to leak small amounts of breast milk since delivery. She is planning to have breast augmentation surgery and was advised by her surgeon to eliminate all leaking of breast milk before her surgery. She denies purulent drainage, pain, palpable lumps. She denies any abnormal vaginal discharge, bleeding, pelvic pain or other concerns.     Past Medical History:  Diagnosis Date  . Gestational diabetes   . Medical history non-contributory   . Pregnancy induced hypertension    Past Surgical History:  Procedure Laterality Date  . NO PAST SURGERIES    . TUBAL LIGATION N/A 05/23/2018   Procedure: BILATERAL TUBAL LIGATION;  Surgeon: Rubie Maid, MD;  Location: [facility] ORS;  Service: Gynecology;  Laterality: N/A;   The following portions of the patient's history were reviewed and updated as appropriate: allergies, current medications, past family history, past medical history, past social history, past surgical history and problem list.   Health Maintenance:  Normal pap 11/26/2016.  No mammogram history, age 66  Review of Systems:  Pertinent items noted in HPI and remainder of comprehensive ROS otherwise negative.  Physical Exam:   General:  Alert, oriented and  cooperative.   Mental Status: Normal mood and affect perceived. Normal judgment and thought content.  Physical exam deferred due to nature of the encounter  Labs and Imaging No results found for this or any previous visit (from the past 336 hour(s)). No results found.    Assessment and Plan:     1. Lactation suppression --Discussed close tracking of frequency and amount of leaking to better ascertain extent --Discussed common OTC interventions with mixed evidence-based data including teas and Sudafed --Advised compression, cold compresses, eliminate all nipple/breast stimulation, --Schedule in-person appointment if no improvement in 3 months     I discussed the assessment and treatment plan with the patient. The patient was provided an opportunity to ask questions and all were answered. The patient agreed with the plan and demonstrated an understanding of the instructions.   The patient was advised to call back or seek an in-person evaluation/go to the ED if the symptoms worsen or if the condition fails to improve as anticipated.  Patient was originally scheduled for MyChart appointment but was unable to secure connectivity the day of her appointment.  I provided ten minutes of non-face-to-face time during this encounter.   Darlina Rumpf, [place] [facility] [employer], [place]

## 2019-05-26 NOTE — Progress Notes (Signed)
Leaking breast milk, baby is a year old and she never breast fed. Pt is going to have breast augmentation surgery this fall/winter.  I connected with  Raechel Ache on 05/26/19 at  9:00 AM EDT by telephone and verified that I am speaking with the correct person using two identifiers.   I discussed the limitations, risks, security and privacy concerns of performing an evaluation and management service by telephone and the availability of in person appointments. I also discussed with the patient that there may be a patient responsible charge related to this service. The patient expressed understanding and agreed to proceed.  Crosby Oyster, RN 05/26/2019  9:12 AM

## 2019-06-17 ENCOUNTER — Other Ambulatory Visit: Payer: Self-pay

## 2019-06-17 DIAGNOSIS — N760 Acute vaginitis: Secondary | ICD-10-CM

## 2019-06-17 DIAGNOSIS — N898 Other specified noninflammatory disorders of vagina: Secondary | ICD-10-CM

## 2019-06-17 DIAGNOSIS — B9689 Other specified bacterial agents as the cause of diseases classified elsewhere: Secondary | ICD-10-CM

## 2019-06-17 MED ORDER — METRONIDAZOLE 500 MG PO TABS: 500.0000 mg | ORAL_TABLET | Freq: Two times a day (BID) | ORAL | 1 refills | Status: DC

## 2019-06-17 NOTE — Telephone Encounter (Signed)
Patient request Flagyl for BV.

## 2019-07-06 ENCOUNTER — Telehealth (HOSPITAL_COMMUNITY): Payer: Self-pay | Admitting: Lactation Services

## 2019-07-06 NOTE — Telephone Encounter (Signed)
Called Carrie Cameron at request of Dr. Ilda Basset to discuss drying up milk.   Carrie Cameron has a 31 yo and did not BF that infant, milk has been leaking since infants birth.   Carrie Cameron has tried Sudafed, Benadryl, Cool gel from the internet that is supposed to dry up milk and has noticed a decrease although not stopped.   Sudafed was the 4 hour that she tried and she has been taking for 1.5 months and taking once a day. She is taking Benadryl 2 x a day 25 mg.   Carrie Cameron has avoided breast stimulation for a few months.   Carrie Cameron reports leaking once at night in the middle of the night. Carrie Cameron reports she sees drops on clothing, there is not puddle. About 4 gtts from each breast and in the bra. The milk is white. She reports the amount has decreased since beginning Sudafed and Benadryl.   Suggested she try Sudafed 12 hour 2 x a day for a week to see if it helps. She is to stop Benadryl during the day and take at night if she wishes. It is causing sleepiness during the day. Carrie Cameron has Sage tea at home that she will also try. Carrie Cameron is wearing a fitting, not tight bra. Discussed estrogen containing OCP's may also help to dry up milk although Carrie Cameron does not need birth control and hormones may not be indicated due to upcoming surgery.   Discussed with Carrie Cameron that if milk continues to leak after trying above that it may be indicated that her Prolactin levels be checked and if elevated may need to assess for Pituitary growths.   Carrie Cameron is having breast surgery  In October and surgeon wants her milk dried up to prevent infection with the surgery.   Carrie Cameron given phone # to call back in the next few weeks if leaking continues. Carrie Cameron voiced understanding.

## 2019-08-10 ENCOUNTER — Telehealth: Payer: Self-pay | Admitting: Lactation Services

## 2019-08-10 DIAGNOSIS — O9270 Unspecified disorders of lactation: Secondary | ICD-10-CM

## 2019-08-10 NOTE — Telephone Encounter (Signed)
Pt reports she tried the Sudafed and it did not work.   Pt reports she was pumping for 3 days and then stopped to try to get her body to stop. She was able to pump about 2 ml per each breast. Pt was not advised to try pumping at our last conversation.   Pt reports she is noting drops of milk in her bra at night as she was previously.   She had to cancel her breast augmentation surgery until this it resolved. She would like to have her surgery before the end of the year.   Pt wants to know what she can try next. May be worth obtaining Prolactin Levels to see if they are elevated.   Pt asked about trying Estrogen Containing BCP's to see if that will help.   Will reach out to Dr. Ilda Basset to see what next step will be.

## 2019-08-13 NOTE — Telephone Encounter (Signed)
I'm not sure. It looks like Sam saw this patient last. I'd ask her and see what her thoughts are

## 2019-08-17 ENCOUNTER — Telehealth: Payer: Self-pay | Admitting: Lactation Services

## 2019-08-17 ENCOUNTER — Telehealth (HOSPITAL_COMMUNITY): Payer: Self-pay | Admitting: Lactation Services

## 2019-08-17 NOTE — Telephone Encounter (Signed)
Called and spoke with pt to let her know that Sam would like to have patient seen in the office. [place] office was notified. Pt is usually seen in the [facility]. Message sent to [facility] office for follow up.

## 2019-08-17 NOTE — Telephone Encounter (Signed)
Called pt to let her know that the provider wants her seen in the office. Was not able to leave a message as voicemail was not set up. Will send My Chart message. Will also notify front office to call and have pt come in the office for evaluation.

## 2019-08-30 ENCOUNTER — Ambulatory Visit (INDEPENDENT_AMBULATORY_CARE_PROVIDER_SITE_OTHER): Payer: Self-pay | Admitting: Family Medicine

## 2019-08-30 ENCOUNTER — Other Ambulatory Visit: Payer: Self-pay

## 2019-08-30 ENCOUNTER — Encounter: Payer: Self-pay | Admitting: Family Medicine

## 2019-08-30 VITALS — BP 122/82 | HR 72 | Wt 137.0 lb

## 2019-08-30 DIAGNOSIS — N643 Galactorrhea not associated with childbirth: Secondary | ICD-10-CM

## 2019-08-30 NOTE — Assessment & Plan Note (Signed)
Check TSH and PRL--advised of normal adenomas. May need meds. Do not stimulate breasts at all.

## 2019-08-30 NOTE — Progress Notes (Signed)
   Subjective:    Patient ID: Carrie Cameron is a 33 y.o. female presenting with Galactorrhea  on 08/30/2019  HPI: Here with persistent breast leaking of white fluid x 1 year. Last gave birth 8/19, and never breast feed. Finds evidence of leakage most nights and can get drainage if squeezes nipples. Tried Benadryl and Sudafed x 1 month, without improvement. Cycles are normal. Tried to pump for a few days and then stop.  Review of Systems  Constitutional: Negative for chills and fever.  Respiratory: Negative for shortness of breath.   Cardiovascular: Negative for chest pain.  Gastrointestinal: Negative for abdominal pain, nausea and vomiting.  Genitourinary: Negative for dysuria.  Skin: Negative for rash.      Objective:    BP 122/82   Pulse 72   Wt 137 lb (62.1 kg)   BMI 26.76 kg/m  Physical Exam Constitutional:      General: She is not in acute distress.    Appearance: She is well-developed.  HENT:     Head: Normocephalic and atraumatic.  Eyes:     General: No visual field deficit or scleral icterus. Neck:     Musculoskeletal: Neck supple.  Cardiovascular:     Rate and Rhythm: Normal rate.  Pulmonary:     Effort: Pulmonary effort is normal.  Abdominal:     Palpations: Abdomen is soft.  Skin:    General: Skin is warm and dry.  Neurological:     Mental Status: She is alert and oriented to person, place, and time.         Assessment & Plan:   Problem List Items Addressed This Visit      Unprioritized   Galactorrhea - Primary    Check TSH and PRL--advised of normal adenomas. May need meds. Do not stimulate breasts at all.      Relevant Orders   Prolactin   TSH      Total face-to-face time with patient: 15 minutes. Over 50% of encounter was spent on counseling and coordination of care. Return in about 4 weeks (around 09/27/2019), or if symptoms worsen or fail to improve.  Donnamae Jude 08/30/2019 2:42 PM

## 2019-08-30 NOTE — Patient Instructions (Signed)
Galactorrhea Galactorrhea is the flow of a milky fluid (discharge) from the breast. It is different from normal milk in nursing mothers. The fluid can be white, yellow, or green. This condition can be caused by many things. Most cases are not serious and do not require treatment. Watch your condition to make sure it goes away. Follow these instructions at home: Breast care   Watch your condition for any changes.  Do not squeeze your breasts or nipples.  Avoid touching your breasts when you are having sex.  Perform a breast self-exam once a month.  Avoid clothes that rub on your nipples.  Use breast pads to absorb the fluid.  Wear a support bra or a breast binder. General instructions  Take over-the-counter and prescription medicines only as told by your doctor.  Keep all follow-up visits as told by your doctor. This is important. Contact a doctor if you:  Have hot flashes.  Have vaginal dryness.  Have no desire for sex.  Stop having periods, or have periods that are irregular or far apart.  Have headaches.  Cannot see well. Get help right away if:  Your breast discharge is bloody or yellowish white (puslike).  You have breast pain.  You feel a lump in your breast.  Your breast shows wrinkling or dimpling.  Your breast becomes red and swollen. Summary  Galactorrhea is the flow of a milky fluid (discharge) from the breast.  This condition may be caused by many things, but it is not usually serious.  Watch your condition carefully to make sure that it goes away.  Get help right away if the fluid is bloody or yellowish white, or if you have a lump, pain, or skin changes on your breast. This information is not intended to replace advice given to you by your health care provider. Make sure you discuss any questions you have with your health care provider. Document Released: 11/09/2010 Document Revised: 10/20/2017 Document Reviewed: 10/20/2017 Elsevier Patient  Education  2020 [employer].

## 2019-08-31 LAB — TSH: TSH: 1.85 u[IU]/mL (ref 0.450–4.500)

## 2019-08-31 LAB — PROLACTIN: Prolactin: 29.1 ng/mL — ABNORMAL HIGH (ref 4.8–23.3)

## 2019-09-10 ENCOUNTER — Other Ambulatory Visit: Payer: Self-pay | Admitting: *Deleted

## 2019-09-10 MED ORDER — FLUCONAZOLE 150 MG PO TABS: 150.0000 mg | ORAL_TABLET | Freq: Once | ORAL | 1 refills | Status: AC

## 2019-09-10 NOTE — Telephone Encounter (Signed)
Pt called requesting meds for yeast infection, Rx sent into pharmacy.

## 2019-10-25 ENCOUNTER — Ambulatory Visit (INDEPENDENT_AMBULATORY_CARE_PROVIDER_SITE_OTHER): Payer: Self-pay

## 2019-10-25 ENCOUNTER — Other Ambulatory Visit: Payer: Self-pay

## 2019-10-25 VITALS — BP 125/86 | HR 72

## 2019-10-25 DIAGNOSIS — R300 Dysuria: Secondary | ICD-10-CM

## 2019-10-25 LAB — POCT URINE QUALITATIVE DIPSTICK BLOOD: Blood, UA: NEGATIVE

## 2019-10-25 NOTE — Progress Notes (Signed)
SUBJECTIVE: Carrie Cameron is a 34 y.o. female who complains of urinary frequency, urgency and dysuria  For a couple of days, without flank pain, fever, chills, or abnormal vaginal discharge or bleeding.   OBJECTIVE: Appears well, in no apparent distress.  Vital signs are normal. Urine dipstick shows within normal range and no signs of infection.    ASSESSMENT: Dysuria  PLAN: Treatment per orders.  Call or return to clinic prn if these symptoms worsen or fail to improve as anticipated.

## 2019-10-28 ENCOUNTER — Other Ambulatory Visit: Payer: Self-pay | Admitting: Obstetrics & Gynecology

## 2019-10-28 DIAGNOSIS — N3000 Acute cystitis without hematuria: Secondary | ICD-10-CM

## 2019-10-28 LAB — URINE CULTURE

## 2019-10-28 MED ORDER — CEFADROXIL 500 MG PO CAPS: 500.0000 mg | ORAL_CAPSULE | Freq: Two times a day (BID) | ORAL | 0 refills | Status: DC

## 2019-11-18 ENCOUNTER — Encounter: Payer: Self-pay | Admitting: Family Medicine

## 2019-11-18 ENCOUNTER — Telehealth (INDEPENDENT_AMBULATORY_CARE_PROVIDER_SITE_OTHER): Payer: Self-pay | Admitting: Family Medicine

## 2019-11-18 ENCOUNTER — Other Ambulatory Visit: Payer: Self-pay

## 2019-11-18 DIAGNOSIS — N643 Galactorrhea not associated with childbirth: Secondary | ICD-10-CM

## 2019-11-18 NOTE — Assessment & Plan Note (Signed)
Slightly elevated Prolactin level--has continued to leak milk despite change in bra and no nipple stimulation. Needs imaging-will attempt to get charity care to allow imaging and possible referral to endocrinologist.

## 2019-11-18 NOTE — Progress Notes (Signed)
    TELEHEALTH GYNECOLOGY VIRTUAL VIDEO VISIT ENCOUNTER NOTE  Provider location: [facility] [employer] at [facility]   I connected with Carrie Cameron on 11/18/19 at  9:00 AM EST by WebEx Video Encounter at home and verified that I am speaking with the correct person using two identifiers.   I discussed the limitations, risks, security and privacy concerns of performing an evaluation and management service virtually and the availability of in person appointments. I also discussed with the patient that there may be a patient responsible charge related to this service. The patient expressed understanding and agreed to proceed.   History:  Carrie Cameron is a 34 y.o. [phone] female being evaluated today for breast leaking. Sill lactating. Notes leaks out her bra and clothes, happens at night and during the day. She did not breast feed at all. She is s/p BTL. She denies any abnormal vaginal discharge, bleeding, pelvic pain or other concerns.       Past Medical History:  Diagnosis Date  . Gestational diabetes   . Medical history non-contributory   . Pregnancy induced hypertension    Past Surgical History:  Procedure Laterality Date  . NO PAST SURGERIES    . TUBAL LIGATION N/A 05/23/2018   Procedure: BILATERAL TUBAL LIGATION;  Surgeon: Rubie Maid, MD;  Location: [facility] ORS;  Service: Gynecology;  Laterality: N/A;   The following portions of the patient's history were reviewed and updated as appropriate: allergies, current medications, past family history, past medical history, past social history, past surgical history and problem list.   Health Maintenance:  Normal pap and negative HRHPV on 11/2016.    Review of Systems:  Pertinent items noted in HPI and remainder of comprehensive ROS otherwise negative.  Physical Exam:   General:  Alert, oriented and cooperative. Patient appears to be in no acute distress.  Mental Status: Normal mood and affect. Normal  behavior. Normal judgment and thought content.   Respiratory: Normal respiratory effort, no problems with respiration noted  Rest of physical exam deferred due to type of encounter  Labs and Imaging No results found for this or any previous visit (from the past 336 hour(s)). No results found.     Assessment and Plan:     Problem List Items Addressed This Visit      Unprioritized   Galactorrhea - Primary    Slightly elevated Prolactin level--has continued to leak milk despite change in bra and no nipple stimulation. Needs imaging-will attempt to get charity care to allow imaging and possible referral to endocrinologist.         I discussed the assessment and treatment plan with the patient. The patient was provided an opportunity to ask questions and all were answered. The patient agreed with the plan and demonstrated an understanding of the instructions.   The patient was advised to call back or seek an in-person evaluation/go to the ED if the symptoms worsen or if the condition fails to improve as anticipated.   Donnamae Jude, MD [facility] [employer], [place]

## 2019-11-18 NOTE — Progress Notes (Signed)
I connected with  Raechel Ache on 11/18/19 at  9:00 AM EST by telephone and verified that I am speaking with the correct person using two identifiers.   I discussed the limitations, risks, security and privacy concerns of performing an evaluation and management service by telephone and the availability of in person appointments. I also discussed with the patient that there may be a patient responsible charge related to this service. The patient expressed understanding and agreed to proceed.  Crosby Oyster, RN  11/18/2019  9:11 AM   Still lactating

## 2019-11-18 NOTE — Patient Instructions (Signed)
Galactorrhea Galactorrhea is an abnormal milky discharge from the breast. The discharge may come from one or both nipples. The fluid is often white, yellow, or green. It is different from the normal milk produced in nursing mothers. Galactorrhea usually occurs in women, but it can sometimes affect men. Various things can cause galactorrhea, such as:  Irritation of the breast, which can result from injury, stimulation during sexual activity, or clothes rubbing against the nipple.  Medicines.  Changes in hormone levels. In many cases, galactorrhea will go away without treatment. However, galactorrhea can also be a sign of something more serious, such as diseases of the kidney or thyroid, or problems with the pituitary gland. Your health care provider may do various tests to help determine the cause. Sometimes the cause is unknown. It is important to monitor your condition to make sure that it goes away. Follow these instructions at home:  Breast care  Watch your condition for any changes.  Do not squeeze your breasts or nipples.  Avoid breast stimulation during sexual activity.  Perform a breast self-exam once a month. Doing this more often can irritate your breasts.  Avoid clothes that rub on your nipples.  Use breast pads to absorb the discharge.  Wear a breast binder or a support bra to help prevent clothes from rubbing on your nipples. General instructions  Take over-the-counter and prescription medicines only as told by your health care provider.  Keep all follow-up visits as told by your health care provider. This is important. Contact a health care provider if you:  Develop hot flashes, vaginal dryness, or a lack of sexual desire.  Stop having menstrual periods, or they are irregular or far apart.  Have headaches.  Have vision problems. Get help right away if you:  Have breast discharge that is bloody or pus-like.  Have breast pain.  Feel a lump in your  breast.  Have wrinkling or dimpling on your breast.  Notice that your breast becomes red and swollen. Summary  Galactorrhea is an abnormal milky discharge from the breast. The fluid may come from one or both nipples and is often white, yellow, or green.  Galactorrhea may be caused by various things, such as irritation of the nipples, medicines, or changes in hormone levels.  Galactorrhea often goes away without treatment. However, it also may be a sign of something more serious, such as diseases of the kidney or thyroid, or problems with the pituitary gland.  Get help right away if you have discharge that is bloody or pus-like, if you have breast pain or a lump, or if you have skin changes on your breast. This information is not intended to replace advice given to you by your health care provider. Make sure you discuss any questions you have with your health care provider. Document Revised: 09/19/2017 Document Reviewed: 09/03/2017 Elsevier Patient Education  2020 [employer] Inc[employer]    

## 2020-04-19 ENCOUNTER — Ambulatory Visit (INDEPENDENT_AMBULATORY_CARE_PROVIDER_SITE_OTHER): Payer: Self-pay

## 2020-04-19 ENCOUNTER — Other Ambulatory Visit: Payer: Self-pay

## 2020-04-19 VITALS — BP 112/78 | HR 72

## 2020-04-19 DIAGNOSIS — R300 Dysuria: Secondary | ICD-10-CM

## 2020-04-19 LAB — POCT URINE QUALITATIVE DIPSTICK BLOOD: Blood, UA: NORMAL

## 2020-04-19 NOTE — Progress Notes (Signed)
Patient was assessed and managed by nursing staff during this encounter. I have reviewed the chart and agree with the documentation and plan. I have also made any necessary editorial changes.  Verita Schneiders, MD 04/19/2020 4:42 PM

## 2020-04-19 NOTE — Progress Notes (Signed)
SUBJECTIVE: Carrie Cameron is a 34 y.o. female who complains of urinary frequency, urgency and dysuria x 3 days, without flank pain, fever, chills, or abnormal vaginal discharge or bleeding.   OBJECTIVE: Appears well, in no apparent distress. Vital signs are normal.Urine dipstick normal at this time.    ASSESSMENT:Dysuria  PLAN: Treatment per orders.Call or return to clinic prn if these symptoms worsen or fail to improve as anticipated.Will go ahead send for culture since patient is having some dysuria symptom.

## 2020-04-22 LAB — URINE CULTURE

## 2020-04-25 ENCOUNTER — Other Ambulatory Visit: Payer: Self-pay | Admitting: Obstetrics & Gynecology

## 2020-04-25 DIAGNOSIS — N3000 Acute cystitis without hematuria: Secondary | ICD-10-CM

## 2020-04-25 MED ORDER — CEFADROXIL 500 MG PO CAPS: 500.0000 mg | ORAL_CAPSULE | Freq: Two times a day (BID) | ORAL | 0 refills | Status: AC

## 2020-09-27 ENCOUNTER — Other Ambulatory Visit (HOSPITAL_COMMUNITY)
Admission: RE | Admit: 2020-09-27 | Discharge: 2020-09-27 | Disposition: A | Discharge status: Home / Self Care | Payer: BC Managed Care – PPO | Source: Ambulatory Visit | Attending: Advanced Practice Midwife | Admitting: Advanced Practice Midwife

## 2020-09-27 ENCOUNTER — Other Ambulatory Visit: Payer: Self-pay

## 2020-09-27 ENCOUNTER — Encounter: Payer: Self-pay | Admitting: Advanced Practice Midwife

## 2020-09-27 ENCOUNTER — Ambulatory Visit (INDEPENDENT_AMBULATORY_CARE_PROVIDER_SITE_OTHER): Payer: BC Managed Care – PPO | Admitting: Advanced Practice Midwife

## 2020-09-27 VITALS — BP 114/74 | HR 91 | Ht 60.0 in | Wt 145.4 lb

## 2020-09-27 DIAGNOSIS — Z113 Encounter for screening for infections with a predominantly sexual mode of transmission: Secondary | ICD-10-CM | POA: Diagnosis not present

## 2020-09-27 DIAGNOSIS — R87810 Cervical high risk human papillomavirus (HPV) DNA test positive: Secondary | ICD-10-CM | POA: Diagnosis not present

## 2020-09-27 DIAGNOSIS — N760 Acute vaginitis: Secondary | ICD-10-CM | POA: Diagnosis not present

## 2020-09-27 DIAGNOSIS — N643 Galactorrhea not associated with childbirth: Secondary | ICD-10-CM | POA: Diagnosis not present

## 2020-09-27 DIAGNOSIS — Z1151 Encounter for screening for human papillomavirus (HPV): Secondary | ICD-10-CM | POA: Insufficient documentation

## 2020-09-27 DIAGNOSIS — Z01419 Encounter for gynecological examination (general) (routine) without abnormal findings: Secondary | ICD-10-CM | POA: Insufficient documentation

## 2020-09-27 DIAGNOSIS — Z9851 Tubal ligation status: Secondary | ICD-10-CM | POA: Diagnosis not present

## 2020-09-27 DIAGNOSIS — B9689 Other specified bacterial agents as the cause of diseases classified elsewhere: Secondary | ICD-10-CM | POA: Diagnosis not present

## 2020-09-27 MED ORDER — METRONIDAZOLE 500 MG PO TABS: 500.0000 mg | ORAL_TABLET | Freq: Two times a day (BID) | ORAL | 0 refills | Status: DC

## 2020-09-27 NOTE — Progress Notes (Signed)
GYNECOLOGY PROBLEM VISIT NOTE  History:     Carrie Cameron is a 34 y.o. [phone] female here for a routine annual gynecologic exam.  Current complaints: recurrent bilateral nipple leaking.  Patient endorses active leaking with breast stimulation, as much as 4oz from each breast. She desires breast augmentation and has had to cancel her procedure due to unresolved leaking. Denies abnormal vaginal bleeding, discharge, pelvic pain, problems with intercourse or other gynecologic concerns.    Gynecologic History Patient's last menstrual period was 08/28/2020 (approximate). Contraception: tubal ligation Last Pap: 11/2016. Results were: normal with negative HPV   Obstetric History OB History  Gravida Para Term Preterm AB Living  10 6 6  0 4 6  SAB TAB Ectopic Multiple Live Births  4 0 0 0 6    # Outcome Date GA Lbr Len/2nd Weight Sex Delivery Anes PTL Lv  10 Term 05/22/18 [redacted]w[redacted]d 06:35 / 00:20 6 lb 15.1 oz (3.15 kg) M Vag-Spont EPI  LIV  9 SAB 07/21/16     SAB     8 Term 09/03/12 [redacted]w[redacted]d  7 lb (3.175 kg) M Vag-Spont   LIV  7 SAB 2013 [redacted]w[redacted]d         6 Term 07/31/08 [redacted]w[redacted]d  10 lb (4.536 kg) F    LIV  5 Term 01/27/07 [redacted]w[redacted]d  8 lb (3.629 kg) F    LIV  4 SAB 2007 101w0d         3 Term 06/03/04 [redacted]w[redacted]d  8 lb (3.629 kg) M Vag-Spont   LIV  2 Term 11/25/02 [redacted]w[redacted]d  8 lb (3.629 kg) M Vag-Spont   LIV     Complications: Postpartum hemorrhage  1 SAB      SAB       Past Medical History:  Diagnosis Date  . Gestational diabetes   . Medical history non-contributory   . Pregnancy induced hypertension     Past Surgical History:  Procedure Laterality Date  . NO PAST SURGERIES    . TUBAL LIGATION N/A 05/23/2018   Procedure: BILATERAL TUBAL LIGATION;  Surgeon: Rubie Maid, MD;  Location: [facility] ORS;  Service: Gynecology;  Laterality: N/A;    No current outpatient medications on file prior to visit.   No current facility-administered medications on file prior to visit.    No Known  Allergies  Social History:  reports that she has never smoked. She has never used smokeless tobacco. She reports that she does not drink alcohol and does not use drugs.  Family History  Problem Relation Age of Onset  . Diabetes Maternal Uncle     The following portions of the patient's history were reviewed and updated as appropriate: allergies, current medications, past family history, past medical history, past social history, past surgical history and problem list.  Review of Systems Pertinent items noted in HPI and remainder of comprehensive ROS otherwise negative.  Physical Exam:  BP 114/74   Pulse 91   Ht 5' (1.524 m)   Wt 145 lb 6.4 oz (66 kg)   LMP 08/28/2020 (Approximate)   BMI 28.40 kg/m  CONSTITUTIONAL: Well-developed, well-nourished female in no acute distress.  HENT:  Normocephalic, atraumatic, External right and left ear normal.  EYES: Conjunctivae and EOM are normal. Pupils are equal, round, and reactive to light. No scleral icterus.  NECK: Normal range of motion, supple, no masses.  Normal thyroid.  SKIN: Skin is warm and dry. No rash noted. Not diaphoretic. No erythema. No pallor. MUSCULOSKELETAL: Normal range of motion.  No tenderness.  No cyanosis, clubbing, or edema.   NEUROLOGIC: Alert and oriented to person, place, and time. Normal reflexes, muscle tone coordination.  PSYCHIATRIC: Normal mood and affect. Normal behavior. Normal judgment and thought content. CARDIOVASCULAR: Normal heart rate noted, regular rhythm RESPIRATORY: Clear to auscultation bilaterally. Effort and breath sounds normal, no problems with respiration noted. BREASTS: Symmetric in size. No masses, tenderness, skin changes, nipple drainage, or lymphadenopathy bilaterally. Performed in the presence of a chaperone. ABDOMEN: Soft, no distention noted.  No tenderness, rebound or guarding.  PELVIC: Normal appearing external genitalia and urethral meatus; normal appearing vaginal mucosa and cervix.   No abnormal discharge noted.  Pap smear obtained.  Normal uterine size, no other palpable masses, no uterine or adnexal tenderness.  Performed in the presence of a chaperone.   Assessment and Plan:    1. Well woman exam with routine gynecological exam - Treat presumptively for Bacterial Vaginosis - Does not have PCP, encouraged to research and establish care - Cytology - PAP - Hepatitis B surface antigen - Hepatitis C antibody - HIV Antibody (routine testing w rflx) - RPR - Cervicovaginal ancillary only  2. Galactorrhea - Recurrent issue - Gap in follow-up due to loss of patient insurance. Now insured and ready to pursue next steps - No discharge noted during exam  - Unable to collect fasting Prolactin as previously discussed by Dr Kennon Rounds - Ambulatory referral to Endocrinology - US BREAST LTD UNI RIGHT INC AXILLA; Future - US BREAST LTD UNI LEFT INC AXILLA; Future  3. Hx of tubal ligation - 05/2018  Will follow up results of pap smear and manage accordingly. Routine preventative health maintenance measures emphasized. Please refer to After Visit Summary for other counseling recommendations.      Mallie Snooks, MSN, CNM Certified Nurse Midwife, [employer] for [employer], [facility] 09/27/20 3:50 PM

## 2020-09-27 NOTE — Patient Instructions (Addendum)
[facility] "Establish CARE" Main Line: [phone]   Galactorrhea Galactorrhea is the flow of a milky fluid (discharge) from the breast. It is different from normal milk in nursing mothers. The fluid can be white, yellow, or green. This condition can be caused by many things. Most cases are not serious and do not require treatment. Watch your condition to make sure it goes away. Follow these instructions at home: Breast care   Watch your condition for any changes.  Do not squeeze your breasts or nipples.  Avoid touching your breasts when you are having sex.  Perform a breast self-exam once a month.  Avoid clothes that rub on your nipples.  Use breast pads to absorb the fluid.  Wear a support bra or a breast binder. General instructions  Take over-the-counter and prescription medicines only as told by your doctor.  Keep all follow-up visits as told by your doctor. This is important. Contact a doctor if you:  Have hot flashes.  Have vaginal dryness.  Have no desire for sex.  Stop having periods, or have periods that are irregular or far apart.  Have headaches.  Cannot see well. Get help right away if:  Your breast discharge is bloody or yellowish white (puslike).  You have breast pain.  You feel a lump in your breast.  Your breast shows wrinkling or dimpling.  Your breast becomes red and swollen. Summary  Galactorrhea is the flow of a milky fluid (discharge) from the breast.  This condition may be caused by many things, but it is not usually serious.  Watch your condition carefully to make sure that it goes away.  Get help right away if the fluid is bloody or yellowish white, or if you have a lump, pain, or skin changes on your breast. This information is not intended to replace advice given to you by your health care provider. Make sure you discuss any questions you have with your health care provider. Document Revised:  10/20/2017 Document Reviewed: 10/20/2017 Elsevier Patient Education  2020 [employer].

## 2020-09-28 LAB — HEPATITIS C ANTIBODY: Hep C Virus Ab: <0.1 s/co ratio (ref 0.0–0.9)

## 2020-09-28 LAB — RPR: RPR Ser Ql: NONREACTIVE

## 2020-09-28 LAB — HIV ANTIBODY (ROUTINE TESTING W REFLEX): HIV Screen 4th Generation wRfx: NONREACTIVE

## 2020-09-28 LAB — HEPATITIS B SURFACE ANTIGEN: Hepatitis B Surface Ag: NEGATIVE

## 2020-09-29 LAB — CERVICOVAGINAL ANCILLARY ONLY
Bacterial Vaginitis (gardnerella): POSITIVE — AB
Candida Glabrata: NEGATIVE
Candida Vaginitis: NEGATIVE
Chlamydia: NEGATIVE
Comment: NEGATIVE
Comment: NEGATIVE
Comment: NEGATIVE
Comment: NEGATIVE
Comment: NEGATIVE
Comment: NORMAL
Neisseria Gonorrhea: NEGATIVE
Trichomonas: NEGATIVE

## 2020-10-06 ENCOUNTER — Encounter: Payer: Self-pay | Admitting: Advanced Practice Midwife

## 2020-10-06 DIAGNOSIS — B977 Papillomavirus as the cause of diseases classified elsewhere: Secondary | ICD-10-CM | POA: Insufficient documentation

## 2020-10-06 LAB — CYTOLOGY - PAP
Comment: NEGATIVE
Comment: NEGATIVE
Diagnosis: NEGATIVE
Diagnosis: REACTIVE
HPV 16: NEGATIVE
HPV 18 / 45: NEGATIVE
High risk HPV: POSITIVE — AB

## 2020-10-21 DIAGNOSIS — N643 Galactorrhea not associated with childbirth: Secondary | ICD-10-CM

## 2020-10-21 HISTORY — DX: Galactorrhea not associated with childbirth: N64.3

## 2020-10-21 HISTORY — PX: BREAST ENHANCEMENT SURGERY: SHX7

## 2020-10-26 ENCOUNTER — Other Ambulatory Visit: Payer: Self-pay | Admitting: Advanced Practice Midwife

## 2020-10-26 DIAGNOSIS — N643 Galactorrhea not associated with childbirth: Secondary | ICD-10-CM

## 2020-10-31 ENCOUNTER — Other Ambulatory Visit: Payer: BC Managed Care – PPO

## 2020-11-01 DIAGNOSIS — Z20822 Contact with and (suspected) exposure to covid-19: Secondary | ICD-10-CM | POA: Diagnosis not present

## 2020-11-02 ENCOUNTER — Ambulatory Visit: Payer: BC Managed Care – PPO

## 2020-11-02 ENCOUNTER — Other Ambulatory Visit: Payer: Self-pay

## 2020-11-02 ENCOUNTER — Ambulatory Visit
Admission: RE | Admit: 2020-11-02 | Discharge: 2020-11-02 | Disposition: A | Discharge status: Home / Self Care | Payer: BC Managed Care – PPO | Source: Ambulatory Visit | Attending: Advanced Practice Midwife | Admitting: Advanced Practice Midwife

## 2020-11-02 DIAGNOSIS — N6452 Nipple discharge: Secondary | ICD-10-CM | POA: Diagnosis not present

## 2020-11-02 DIAGNOSIS — N643 Galactorrhea not associated with childbirth: Secondary | ICD-10-CM

## 2020-11-03 ENCOUNTER — Other Ambulatory Visit: Payer: BC Managed Care – PPO

## 2020-12-06 ENCOUNTER — Other Ambulatory Visit: Payer: Self-pay

## 2020-12-08 ENCOUNTER — Ambulatory Visit (INDEPENDENT_AMBULATORY_CARE_PROVIDER_SITE_OTHER): Payer: BC Managed Care – PPO | Admitting: Internal Medicine

## 2020-12-08 ENCOUNTER — Encounter: Payer: Self-pay | Admitting: Internal Medicine

## 2020-12-08 ENCOUNTER — Other Ambulatory Visit: Payer: Self-pay

## 2020-12-08 VITALS — BP 114/70 | HR 89 | Ht 60.0 in | Wt 140.5 lb

## 2020-12-08 DIAGNOSIS — N643 Galactorrhea not associated with childbirth: Secondary | ICD-10-CM | POA: Diagnosis not present

## 2020-12-08 DIAGNOSIS — D229 Melanocytic nevi, unspecified: Secondary | ICD-10-CM

## 2020-12-08 LAB — COMPREHENSIVE METABOLIC PANEL
ALT: 32 U/L (ref 0–35)
AST: 23 U/L (ref 0–37)
Albumin: 4.2 g/dL (ref 3.5–5.2)
Alkaline Phosphatase: 65 U/L (ref 39–117)
BUN: 12 mg/dL (ref 6–23)
CO2: 26 mEq/L (ref 19–32)
Calcium: 9.2 mg/dL (ref 8.4–10.5)
Chloride: 105 mEq/L (ref 96–112)
Creatinine, Ser: 0.69 mg/dL (ref 0.40–1.20)
GFR: 114.34 mL/min (ref >60.00)
Glucose, Bld: 103 mg/dL — ABNORMAL HIGH (ref 70–99)
Potassium: 3.3 mEq/L — ABNORMAL LOW (ref 3.5–5.1)
Sodium: 137 mEq/L (ref 135–145)
Total Bilirubin: 0.4 mg/dL (ref 0.2–1.2)
Total Protein: 7.3 g/dL (ref 6.0–8.3)

## 2020-12-08 LAB — TSH: TSH: 2.06 u[IU]/mL (ref 0.35–4.50)

## 2020-12-08 LAB — T4, FREE: Free T4: 0.81 ng/dL (ref 0.60–1.60)

## 2020-12-08 MED ORDER — CABERGOLINE 0.5 MG PO TABS: 0.2500 mg | ORAL_TABLET | ORAL | 2 refills | Status: DC

## 2020-12-08 NOTE — Patient Instructions (Signed)
-   Start Cabergoline 0.5 mg, HALF a tablet TWICE a week

## 2020-12-08 NOTE — Progress Notes (Signed)
Name: Carrie Cameron  MRN/ DOB: [id number], 1986/02/21    Age/ Sex: 35 y.o., female    PCP: Patient, No Pcp Per   Reason for Endocrinology Evaluation: Galactorrhea     Date of Initial Endocrinology Evaluation: 12/08/2020       HPI: Carrie Cameron is a 35 y.o. female with unremarkable  past medical history . The patient presented for initial endocrinology clinic visit on 12/08/2020 for consultative assistance with her Galactorrhea .   Has been noted with Galactorrhea since the delivery of her last son on 06/08/2018, she did not nurse him In 2020 she was noted an elevated prolactin at 29.1 ng/mL  But was lost to follow up .   She has 6 kids and had no issues in the past with galactorrhea   Has tried pseudoephedrine and benadryl as well as tight bras without help   Recent mammogram per pt is normal - records not available for [place]    Denies headaches or vision changes  LMP 11/07/2020 - irregular   Has been on OCPs over the years, until tubal ligation in 2019   Galactorrhea is distressing , when she sleeps at night she can feel the milk seeping and wakes her up.     No FH of endocrine issues   HISTORY:  Past Medical History:  Past Medical History:  Diagnosis Date  . Gestational diabetes   . Medical history non-contributory   . Pregnancy induced hypertension    Past Surgical History:  Past Surgical History:  Procedure Laterality Date  . NO PAST SURGERIES    . TUBAL LIGATION N/A 05/23/2018   Procedure: BILATERAL TUBAL LIGATION;  Surgeon: Rubie Maid, MD;  Location: [facility] ORS;  Service: Gynecology;  Laterality: N/A;      Social History:  reports that she has never smoked. She has never used smokeless tobacco. She reports that she does not drink alcohol and does not use drugs.  Family History: family history includes Diabetes in her maternal uncle.   HOME MEDICATIONS: Allergies as of 12/08/2020   No Known Allergies     Medication List        Accurate as of December 08, 2020  2:12 PM. If you have any questions, ask your nurse or doctor.        STOP taking these medications   metroNIDAZOLE 500 MG tablet Commonly known as: Flagyl Stopped by: Dorita Sciara, MD     TAKE these medications   cabergoline 0.5 MG tablet Commonly known as: DOSTINEX Take 0.5 tablets (0.25 mg total) by mouth 2 (two) times a week. Start taking on: December 11, 2020 Started by: Dorita Sciara, MD         REVIEW OF SYSTEMS: A comprehensive ROS was conducted with the patient and is negative except as per HPI   OBJECTIVE:  VS: BP 114/70   Pulse 89   Ht 5' (1.524 m)   Wt 140 lb 8 oz (63.7 kg)   LMP 11/07/2020 (Approximate)   SpO2 98%   BMI 27.44 kg/m    Wt Readings from Last 3 Encounters:  12/08/20 140 lb 8 oz (63.7 kg)  09/27/20 145 lb 6.4 oz (66 kg)  08/30/19 137 lb (62.1 kg)     EXAM: General: Pt appears well and is in NAD  Neck: General: Supple without adenopathy. Thyroid: Thyroid size normal.  No goiter or nodules appreciated.  Breast : No nodules or lumps of both breakfast, milky discharge noted with  expression   Lungs: Clear with good BS bilat with no rales, rhonchi, or wheezes  Heart: Auscultation: RRR.  Abdomen: Normoactive bowel sounds, soft, nontender, without masses or organomegaly palpable  Extremities:  BL LE: No pretibial edema normal ROM and strength.  Skin: Hair: Texture and amount normal with gender appropriate distribution Skin Inspection: Right scapular mole ~ 0.75 cm in diameter, homogenous color but without a smooth border Skin Palpation: Skin temperature, texture, and thickness normal to palpation  Neuro: Cranial nerves: II - XII grossly intact  Motor: Normal strength throughout DTRs: 2+ and symmetric in UE without delay in relaxation phase  Mental Status: Judgment, insight: Intact Orientation: Oriented to time, place, and person Mood and affect: No depression, anxiety, or agitation      DATA REVIEWED: Results for SHERRYE, PUGA (MRN [id number]) as of 12/11/2020 13:35  Ref. Range 12/08/2020 14:23  Sodium Latest Ref Range: 135 - 145 mEq/L 137  Potassium Latest Ref Range: 3.5 - 5.1 mEq/L 3.3 (L)  Chloride Latest Ref Range: 96 - 112 mEq/L 105  CO2 Latest Ref Range: 19 - 32 mEq/L 26  Glucose Latest Ref Range: 70 - 99 mg/dL 103 (H)  BUN Latest Ref Range: 6 - 23 mg/dL 12  Creatinine Latest Ref Range: 0.40 - 1.20 mg/dL 0.69  Calcium Latest Ref Range: 8.4 - 10.5 mg/dL 9.2  Alkaline Phosphatase Latest Ref Range: 39 - 117 U/L 65  Albumin Latest Ref Range: 3.5 - 5.2 g/dL 4.2  AST Latest Ref Range: 0 - 37 U/L 23  ALT Latest Ref Range: 0 - 35 U/L 32  Total Protein Latest Ref Range: 6.0 - 8.3 g/dL 7.3  Total Bilirubin Latest Ref Range: 0.2 - 1.2 mg/dL 0.4  GFR Latest Ref Range: >60.00 mL/min 114.34  Prolactin Latest Units: ng/mL 17.1  TSH Latest Ref Range: 0.35 - 4.50 uIU/mL 2.06  T4,Free(Direct) Latest Ref Range: 0.60 - 1.60 ng/dL 0.81    ASSESSMENT/PLAN/RECOMMENDATIONS:   1. Idiopathic Galactorrhea:  - galactorrhea has been long term and somewhat distressing, will proceed with treatment with cabergoline as below  -  Prolactin is normal  - Cautioned against GI side effects with cabergoline   Medications : Start Cabergoline 0.5 mg , half a tablet twice a week      2. Mole :   - Its  close to 1 cm in size and I prefer to have this checked by a specialist  - A referral to dermatology has been entered.   F/U in 3 months     Signed electronically by: Mack Guise, MD  [facility]  [facility] [address] [place], [place] [address] Phone: [phone] FAX: [phone]   CC: Patient, No Pcp Per No address on file Phone: None Fax: None   Return to Endocrinology clinic as below: Future Appointments  Date Time Provider [place]  03/08/2021  8:10 AM Lorn Butcher, Melanie Crazier, MD  [facility] None

## 2020-12-09 LAB — PROLACTIN: Prolactin: 17.1 ng/mL

## 2020-12-11 DIAGNOSIS — D229 Melanocytic nevi, unspecified: Secondary | ICD-10-CM | POA: Insufficient documentation

## 2020-12-21 ENCOUNTER — Ambulatory Visit: Payer: BC Managed Care – PPO | Admitting: Internal Medicine

## 2021-03-08 ENCOUNTER — Ambulatory Visit: Payer: BC Managed Care – PPO | Admitting: Internal Medicine

## 2021-03-08 NOTE — Progress Notes (Deleted)
Name: Carrie Cameron  MRN/ DOB: [id number], [birth year]    Age/ Sex: 35 y.o., female     PCP: Patient, No Pcp Per (Inactive)   Reason for Endocrinology Evaluation: Galactorrhea     Initial Endocrinology Clinic Visit: 12/08/2020    PATIENT IDENTIFIER: Ms. Carrie Cameron is a 35 y.o., female with a past medical history of Galactorrhea. She has followed with [facility] since 12/08/2020 for consultative assistance with management of her Galactorrhea.   HISTORICAL SUMMARY:  Has been noted with Galactorrhea since the delivery of her last son on 06/08/2018, she did not nurse him In 2020 she was noted an elevated prolactin at 29.1 ng/mL  But was lost to follow up .   She has 6 kids and had no issues in the past with galactorrhea  Has irregular periods  Was on OCPs for years, until tubal ligation in 2019   mammo unremarkable (2022) per pt (no records available )  On initial visit to out clinic she had a normal prolactin at 17.1 ng/Ml but we treated her with cabergoline as galactorrhea was distressing to her.   No FH of endocrine issues  SUBJECTIVE:     Today (03/08/2021):  Ms. Carrie Cameron is here for galactorrhea .      HISTORY:  Past Medical History:  Past Medical History:  Diagnosis Date  . Gestational diabetes   . Medical history non-contributory   . Pregnancy induced hypertension     Past Surgical History:  Past Surgical History:  Procedure Laterality Date  . NO PAST SURGERIES    . TUBAL LIGATION N/A 05/23/2018   Procedure: BILATERAL TUBAL LIGATION;  Surgeon: Rubie Maid, MD;  Location: [facility] ORS;  Service: Gynecology;  Laterality: N/A;     Social History:  reports that she has never smoked. She has never used smokeless tobacco. She reports that she does not drink alcohol and does not use drugs. Family History:  Family History  Problem Relation Age of Onset  . Diabetes Maternal Uncle       HOME MEDICATIONS: Allergies as of  03/08/2021   No Known Allergies     Medication List       Accurate as of Mar 08, 2021  7:20 AM. If you have any questions, ask your nurse or doctor.        cabergoline 0.5 MG tablet Commonly known as: DOSTINEX Take 0.5 tablets (0.25 mg total) by mouth 2 (two) times a week.         OBJECTIVE:   PHYSICAL EXAM: VS: There were no vitals taken for this visit.   EXAM: General: Pt appears well and is in NAD  Neck: General: Supple without adenopathy. Thyroid: Thyroid size normal.  No goiter or nodules appreciated. No thyroid bruit.  Lungs: Clear with good BS bilat with no rales, rhonchi, or wheezes  Heart: Auscultation: RRR.  Abdomen: Normoactive bowel sounds, soft, nontender, without masses or organomegaly palpable  Extremities:  BL LE: No pretibial edema normal ROM and strength.  Mental Status: Judgment, insight: Intact Orientation: Oriented to time, place, and person Mood and affect: No depression, anxiety, or agitation     DATA REVIEWED: ***    ASSESSMENT / PLAN / RECOMMENDATIONS:   1. Idiopathic Galactorrhea:  - galactorrhea has been long term and somewhat distressing, will proceed with treatment with cabergoline as below  -  Prolactin is normal  - Cautioned against GI side effects with cabergoline   Medications : Start Cabergoline 0.5 mg ,  half a tablet twice a week    Signed electronically by: Mack Guise, MD  [facility]  [facility] [address] [place], [place] [address] Phone: [phone] FAX: [phone]      CC: Patient, No Pcp Per (Inactive) No address on file Phone: None  Fax: None   Return to Endocrinology clinic as below: Future Appointments  Date Time Provider [place]  03/08/2021  8:10 AM Quincie Haroon, Melanie Crazier, MD [facility] None  05/16/2021 10:30 AM Warren Danes, PA-C [facility]

## 2021-05-16 ENCOUNTER — Ambulatory Visit (INDEPENDENT_AMBULATORY_CARE_PROVIDER_SITE_OTHER): Payer: BC Managed Care – PPO | Admitting: Physician Assistant

## 2021-05-16 ENCOUNTER — Encounter: Payer: Self-pay | Admitting: Physician Assistant

## 2021-05-16 ENCOUNTER — Other Ambulatory Visit: Payer: Self-pay

## 2021-05-16 DIAGNOSIS — L821 Other seborrheic keratosis: Secondary | ICD-10-CM

## 2021-05-16 DIAGNOSIS — L603 Nail dystrophy: Secondary | ICD-10-CM | POA: Diagnosis not present

## 2021-05-16 NOTE — Progress Notes (Signed)
   New Patient   Subjective  Carrie Cameron is a 35 y.o. female who presents for the following: Skin Problem Patient has a lesion on left side temple area. X 2 years ago. Has grown per patient. Also has one on the back. She had had it all of her life. She can't tell if its changed, but wants it evaluated. Left middle toe looks dark and infected per patient.    The following portions of the chart were reviewed this encounter and updated as appropriate:  Tobacco  Allergies  Meds  Problems  Med Hx  Surg Hx  Fam Hx      Objective  Well appearing patient in no apparent distress; mood and affect are within normal limits.  All skin waist up examined.  Left Corky Downs crusty plaque  left third and first toenail Unable to evaluate the great toe nail due to gel polish. Left third toenail thick.    Assessment & Plan  Seborrheic keratosis Left Temple  observe  Onychoschizia left third and first toenail  Recheck when polish is removed for a few months. Return to clinic if changes.   I, Franke Menter, PA-C, have reviewed all documentation's for this visit.  The documentation on 05/16/21 for the exam, diagnosis, procedures and orders are all accurate and complete.

## 2021-08-17 ENCOUNTER — Ambulatory Visit: Payer: BC Managed Care – PPO | Admitting: Family

## 2021-08-30 ENCOUNTER — Encounter: Payer: Self-pay | Admitting: Radiology

## 2021-11-14 ENCOUNTER — Ambulatory Visit (INDEPENDENT_AMBULATORY_CARE_PROVIDER_SITE_OTHER): Payer: Managed Care, Other (non HMO) | Admitting: Physician Assistant

## 2021-11-14 ENCOUNTER — Encounter: Payer: Self-pay | Admitting: Physician Assistant

## 2021-11-14 ENCOUNTER — Other Ambulatory Visit: Payer: Self-pay

## 2021-11-14 DIAGNOSIS — L603 Nail dystrophy: Secondary | ICD-10-CM | POA: Diagnosis not present

## 2021-11-16 ENCOUNTER — Encounter: Payer: Self-pay | Admitting: Physician Assistant

## 2021-11-16 NOTE — Progress Notes (Signed)
° °  Follow-Up Visit   Subjective  Carrie Cameron is a 36 y.o. female who presents for the following: Nail Problem (Left foot follow up great toe and 3 rd toe no treatment due to gel polish at last visit ).   The following portions of the chart were reviewed this encounter and updated as appropriate:  Tobacco   Allergies   Meds   Problems   Med Hx   Surg Hx   Fam Hx       Objective  Well appearing patient in no apparent distress; mood and affect are within normal limits.  A focused examination was performed including feet. Relevant physical exam findings are noted in the Assessment and Plan.  left foot-4th toenail Thick, dark nail   Assessment & Plan  Onychoschizia left foot-4th toenail  Refer to podiatrist for potential removal.     I, Cylis Ayars, PA-C, have reviewed all documentation's for this visit.  The documentation on 11/16/21 for the exam, diagnosis, procedures and orders are all accurate and complete.

## 2022-01-30 ENCOUNTER — Encounter: Payer: Self-pay | Admitting: Podiatry

## 2022-01-30 ENCOUNTER — Ambulatory Visit (INDEPENDENT_AMBULATORY_CARE_PROVIDER_SITE_OTHER): Payer: Managed Care, Other (non HMO) | Admitting: Podiatry

## 2022-01-30 DIAGNOSIS — L603 Nail dystrophy: Secondary | ICD-10-CM | POA: Diagnosis not present

## 2022-01-30 NOTE — Progress Notes (Signed)
?  Subjective:  ?Patient ID: Carrie Cameron, female    DOB: Aug 30, 1986,  MRN: [id number] ?HPI ?Chief Complaint  ?Patient presents with  ? Toe Pain  ?  3rd toe left - dermatology referred for treatment of wart under the toenail, patient states been like this for about a year, toenail thickened, treatments by dermatologist have failed, referred for further evaluation  ? New Patient (Initial Visit)  ? ? ?36 y.o. female presents with the above complaint.  ? ?ROS: Denies fever chills nausea vomiting muscle aches pains calf pain back pain chest pain shortness of breath. ? ?Past Medical History:  ?Diagnosis Date  ? Gestational diabetes   ? Medical history non-contributory   ? Pregnancy induced hypertension   ? ?Past Surgical History:  ?Procedure Laterality Date  ? NO PAST SURGERIES    ? TUBAL LIGATION N/A 05/23/2018  ? Procedure: BILATERAL TUBAL LIGATION;  Surgeon: Rubie Maid, MD;  Location: [facility] ORS;  Service: Gynecology;  Laterality: N/A;  ? ?No current outpatient medications on file. ? ?No Known Allergies ?Review of Systems ?Objective:  ?There were no vitals filed for this visit. ? ?General: Well developed, nourished, in no acute distress, alert and oriented x3  ? ?Dermatological: Skin is warm, dry and supple bilateral. Nails x 10 are well maintained; remaining integument appears unremarkable at this time. There are no open sores, no preulcerative lesions, no rash or signs of infection present.  Third nail plate left is thick discolored clinically mycotic does not appear to have any verruca characteristics around it. ? ?Vascular: Dorsalis Pedis artery and Posterior Tibial artery pedal pulses are 2/4 bilateral with immedate capillary fill time. Pedal hair growth present. No varicosities and no lower extremity edema present bilateral.  ? ?Neruologic: Grossly intact via light touch bilateral. Vibratory intact via tuning fork bilateral. Protective threshold with Semmes Wienstein monofilament intact to all pedal sites  bilateral. Patellar and Achilles deep tendon reflexes 2+ bilateral. No Babinski or clonus noted bilateral.  ? ?Musculoskeletal: No gross boney pedal deformities bilateral. No pain, crepitus, or limitation noted with foot and ankle range of motion bilateral. Muscular strength 5/5 in all groups tested bilateral. ? ?Gait: Unassisted, Nonantalgic.  ? ? ?Radiographs: ? ?None taken ? ?Assessment & Plan:  ? ?Assessment: Nail dystrophy third toe left foot ? ?Plan: After local anesthetic was administered remove the total nail third toe left foot.  Some no signs of wart.  Took some of the skin around the area to be sent for pathologic evaluation as well. ? ? ? ? ?Annaliyah Willig T. [place], DPM ?

## 2022-01-30 NOTE — Patient Instructions (Signed)

## 2022-02-13 ENCOUNTER — Ambulatory Visit (INDEPENDENT_AMBULATORY_CARE_PROVIDER_SITE_OTHER): Payer: Managed Care, Other (non HMO) | Admitting: Podiatry

## 2022-02-13 DIAGNOSIS — L603 Nail dystrophy: Secondary | ICD-10-CM

## 2022-02-14 ENCOUNTER — Other Ambulatory Visit (HOSPITAL_COMMUNITY)
Admission: RE | Admit: 2022-02-14 | Discharge: 2022-02-14 | Disposition: A | Discharge status: Home / Self Care | Payer: Managed Care, Other (non HMO) | Source: Ambulatory Visit | Attending: Obstetrics & Gynecology | Admitting: Obstetrics & Gynecology

## 2022-02-14 ENCOUNTER — Ambulatory Visit (INDEPENDENT_AMBULATORY_CARE_PROVIDER_SITE_OTHER): Payer: Managed Care, Other (non HMO) | Admitting: Obstetrics & Gynecology

## 2022-02-14 ENCOUNTER — Encounter: Payer: Self-pay | Admitting: Obstetrics & Gynecology

## 2022-02-14 VITALS — BP 95/62 | HR 75 | Ht 60.0 in | Wt 138.0 lb

## 2022-02-14 DIAGNOSIS — Z01419 Encounter for gynecological examination (general) (routine) without abnormal findings: Secondary | ICD-10-CM | POA: Diagnosis present

## 2022-02-14 DIAGNOSIS — R87612 Low grade squamous intraepithelial lesion on cytologic smear of cervix (LGSIL): Secondary | ICD-10-CM

## 2022-02-14 DIAGNOSIS — Z113 Encounter for screening for infections with a predominantly sexual mode of transmission: Secondary | ICD-10-CM | POA: Insufficient documentation

## 2022-02-14 DIAGNOSIS — R87810 Cervical high risk human papillomavirus (HPV) DNA test positive: Secondary | ICD-10-CM

## 2022-02-14 HISTORY — DX: Low grade squamous intraepithelial lesion on cytologic smear of cervix (LGSIL): R87.612

## 2022-02-14 NOTE — Progress Notes (Signed)
? ? ?GYNECOLOGY ANNUAL PREVENTATIVE CARE ENCOUNTER NOTE ? ?History:    ? Carrie Cameron is a 36 y.o. [phone] female here for a routine annual gynecologic exam.  Current complaints: none.  Desires annual STI screen.   Denies abnormal vaginal bleeding, discharge, pelvic pain, problems with intercourse or other gynecologic concerns.  ?  ?Gynecologic History ?Patient's last menstrual period was 01/29/2022 (exact date). ?Contraception: tubal ligation ?Last Pap: 09/27/2020. Result was normal with positive HPV (negative 16, 18/45) ? ?Obstetric History ?OB History  ?Gravida Para Term Preterm AB Living  ?'10 6 6 '$ 0 4 6  ?SAB IAB Ectopic Multiple Live Births  ?4 0 0 0 6  ?  ?# Outcome Date GA Lbr Len/2nd Weight Sex Delivery Anes PTL Lv  ?10 Term 05/22/18 38w0d06:35 / 00:20 6 lb 15.1 oz (3.15 kg) M Vag-Spont EPI  LIV  ?9 SAB 07/21/16     SAB     ?8 Term 09/03/12 455w0d7 lb (3.175 kg) M Vag-Spont   LIV  ?7 SAB 2013 6w59w0d      ?6 Term 07/31/08 40w48w0d lb (4.536 kg) F    LIV  ?5 Term 01/27/07 40w0364w0db (3.629 kg) F    LIV  ?4 SAB 2007 [redacted]w[redacted]d [redacted]w[redacted]d   ?3 Term 06/03/04 [redacted]w[redacted]d [redacted]w[redacted]d(3.629 kg) M Vag-Spont   LIV  ?2 Term 11/25/02 [redacted]w[redacted]d  72w0d3.629 kg) M Vag-Spont   LIV  ?   Complications: Postpartum hemorrhage  ?1 SAB      SAB     ? ? ?Past Medical History:  ?Diagnosis Date  ? Gestational diabetes   ? Pregnancy induced hypertension   ? ? ?Past Surgical History:  ?Procedure Laterality Date  ? BREAST ENHANCEMENT SURGERY Bilateral   ? TUBAL LIGATION N/A 05/23/2018  ? Procedure: BILATERAL TUBAL LIGATION;  Surgeon: Cherry, Rubie Maidocation: [facility];  Service: Gynecology;  Laterality: N/A;  ? ? ?No current outpatient medications on file prior to visit.  ? ?No current facility-administered medications on file prior to visit.  ? ? ?No Known Allergies ? ?Social History:  reports that she has never smoked. She has never used smokeless tobacco. She reports that she does not drink alcohol and does not use drugs. ? ?Family  History  ?Problem Relation Age of Onset  ? Diabetes Maternal Uncle   ? ? ?The following portions of the patient's history were reviewed and updated as appropriate: allergies, current medications, past family history, past medical history, past social history, past surgical history and problem list. ? ?Review of Systems ?Pertinent items noted in HPI and remainder of comprehensive ROS otherwise negative. ? ?Physical Exam:  ?BP 95/62   Pulse 75   Ht 5' (1.524 m)   Wt 138 lb (62.6 kg)   LMP 01/29/2022 (Exact Date)   BMI 26.95 kg/m?  ?CONSTITUTIONAL: Well-developed, well-nourished female in no acute distress.  ?HENT:  Normocephalic, atraumatic, External right and left ear normal.  ?EYES: Conjunctivae and EOM are normal. Pupils are equal, round, and reactive to light. No scleral icterus.  ?NECK: Normal range of motion, supple, no masses.  Normal thyroid.  ?SKIN: Skin is warm and dry. No rash noted. Not diaphoretic. No erythema. No pallor. ?MUSCULOSKELETAL: Normal range of motion. No tenderness.  No cyanosis, clubbing, or edema. ?NEUROLOGIC: Alert and oriented to person, place, and time. Normal reflexes, muscle tone coordination.  ?PSYCHIATRIC: Normal mood and affect.  Normal behavior. Normal judgment and thought content. ?CARDIOVASCULAR: Normal heart rate noted, regular rhythm ?RESPIRATORY: Clear to auscultation bilaterally. Effort and breath sounds normal, no problems with respiration noted. ?BREASTS: Symmetric in size, implants in place bilaterally. No masses, tenderness, skin changes, nipple drainage, or lymphadenopathy bilaterally. Performed in the presence of a chaperone. ?ABDOMEN: Soft, no distention noted.  No tenderness, rebound or guarding.  ?PELVIC: Normal appearing external genitalia and urethral meatus; normal appearing vaginal mucosa and cervix.  No abnormal vaginal discharge noted.  Pap smear obtained.  Normal uterine size, no other palpable masses, no uterine or adnexal tenderness.  Performed in the  presence of a chaperone. ?  ?Assessment and Plan:  ?   ?1. Pap smear of cervix shows high risk HPV present ?2. Well woman exam with routine gynecological exam ?Will follow up results of pap smear and manage accordingly. ?- Cytology - PAP ? ?3. Routine screening for STI (sexually transmitted infection) ?STI screening done.  will follow up results and manage accordingly. ?- Cytology - PAP ancillary testing for GC, Chlam and Trich ?- RPR+HBsAg+HCVAb+HIV ? ?Routine preventative health maintenance measures emphasized. ?Please refer to After Visit Summary for other counseling recommendations.  ?   ? ?Verita Schneiders, MD, FACOG ?Obstetrician Social research officer, government, Faculty Practice ?[facility] ? ?

## 2022-02-14 NOTE — Progress Notes (Signed)
She presents today for a nail check.  She states that seems to be doing well as she refers to the third digit of the left foot.  She has not completed soaking because the toe has dried up. ? ?Objective: Vital signs are stable alert and x3 the nailbed is completely dried at this point there is no drainage no purulence no erythema no cellulitis drainage or odor.  Pat nail pathology was positive for onychomycosis. ? ?Assessment: Well-healing surgical toe onychomycosis. ? ?Plan: At this point she is just going to start applying terbinafine cream daily to the nail as it grows out and I will follow-up with her in a few weeks. ?

## 2022-02-15 LAB — RPR+HBSAG+HCVAB+...
HIV Screen 4th Generation wRfx: NONREACTIVE
Hep C Virus Ab: NONREACTIVE
Hepatitis B Surface Ag: NEGATIVE
RPR Ser Ql: NONREACTIVE

## 2022-02-21 LAB — CYTOLOGY - PAP
Chlamydia: NEGATIVE
Comment: NEGATIVE
Comment: NEGATIVE
Comment: NEGATIVE
Comment: NEGATIVE
Comment: NEGATIVE
Comment: NORMAL
HPV 16: NEGATIVE
HPV 18 / 45: NEGATIVE
High risk HPV: POSITIVE — AB
Neisseria Gonorrhea: NEGATIVE
Trichomonas: NEGATIVE

## 2022-02-22 ENCOUNTER — Encounter: Payer: Self-pay | Admitting: Obstetrics & Gynecology

## 2022-02-22 ENCOUNTER — Telehealth: Payer: Self-pay

## 2022-02-22 DIAGNOSIS — R87612 Low grade squamous intraepithelial lesion on cytologic smear of cervix (LGSIL): Secondary | ICD-10-CM | POA: Insufficient documentation

## 2022-02-22 NOTE — Telephone Encounter (Signed)
TC to pt regarding abnormal pap results and need for Colpo. ?Pt advised on instructions before procedure  ?Pt agreeable and voiced understanding.  ?Pt info given to scheduling to make appt.  ? ?

## 2022-02-28 ENCOUNTER — Other Ambulatory Visit: Payer: Self-pay | Admitting: Obstetrics & Gynecology

## 2022-02-28 ENCOUNTER — Encounter: Payer: Self-pay | Admitting: Obstetrics & Gynecology

## 2022-02-28 ENCOUNTER — Ambulatory Visit (INDEPENDENT_AMBULATORY_CARE_PROVIDER_SITE_OTHER): Payer: Managed Care, Other (non HMO) | Admitting: Obstetrics & Gynecology

## 2022-02-28 VITALS — BP 121/83 | HR 72 | Wt 139.0 lb

## 2022-02-28 DIAGNOSIS — Z3202 Encounter for pregnancy test, result negative: Secondary | ICD-10-CM

## 2022-02-28 DIAGNOSIS — R87612 Low grade squamous intraepithelial lesion on cytologic smear of cervix (LGSIL): Secondary | ICD-10-CM | POA: Diagnosis not present

## 2022-02-28 LAB — POCT URINE PREGNANCY: Preg Test, Ur: NEGATIVE

## 2022-02-28 NOTE — Progress Notes (Signed)
? ? ?  GYNECOLOGY OFFICE COLPOSCOPY PROCEDURE NOTE ? ?36 y.o. [id number] here for colposcopy for low-grade squamous intraepithelial neoplasia (LGSIL - encompassing HPV,mild dysplasia,CIN I) with positive high risk HPV pap smear on 02/14/22. Discussed role for HPV in cervical dysplasia, need for surveillance. ? ?Patient gave informed written consent, time out was performed.  Placed in lithotomy position. Cervix viewed with speculum and colposcope after application of acetic acid.  Patient noted to be on her menstrual period with moderate bleeding noted from cervix. ? ?Colposcopy adequate? Yes ?Acetowhite lesions noted at 12 and 4 o'clock; corresponding biopsies obtained.  ECC specimen obtained (very bloody due to ongoing menstrual period). All specimens were labeled and sent to pathology. ? ?Chaperone was present during entire procedure. ? ?Patient was given post procedure instructions.  Will follow up pathology and manage accordingly; patient will be contacted with results and recommendations.  Routine preventative health maintenance measures emphasized. ? ? ? ?Verita Schneiders, MD, FACOG ?Obstetrician Social research officer, government, Faculty Practice ?[facility] ? ? ? ? ? ? ?

## 2022-02-28 NOTE — Patient Instructions (Signed)
COLPOSCOPY POST-PROCEDURE INSTRUCTIONS  You may take Ibuprofen, Aleve or Tylenol for cramping if needed.  If Monsel's solution was used, you will have a black discharge.  Light bleeding is normal.  If bleeding is heavier than your period, please call.  Put nothing in your vagina until the bleeding or discharge stops (usually 2 or3 days).  We will call you within one week with biopsy results  

## 2022-03-04 ENCOUNTER — Encounter: Payer: Self-pay | Admitting: Obstetrics & Gynecology

## 2022-03-04 DIAGNOSIS — N870 Mild cervical dysplasia: Secondary | ICD-10-CM | POA: Insufficient documentation

## 2022-05-13 ENCOUNTER — Telehealth: Payer: Self-pay | Admitting: Oncology

## 2022-05-13 NOTE — Telephone Encounter (Signed)
Scheduled appt per 7/24 referral. Pt is aware of appt date and time. Pt is aware to arrive 15 mins prior to appt time and to bring and updated insurance card. Pt is aware of appt location.

## 2022-05-31 ENCOUNTER — Telehealth: Payer: Self-pay | Admitting: Oncology

## 2022-05-31 NOTE — Telephone Encounter (Signed)
R/s pt's new hem appt. Pt is aware of new appt date/time.  

## 2022-06-04 ENCOUNTER — Inpatient Hospital Stay: Payer: Managed Care, Other (non HMO) | Admitting: Oncology

## 2022-06-06 HISTORY — PX: ENDOMETRIAL BIOPSY: SHX622

## 2022-06-12 NOTE — Progress Notes (Unsigned)
[facility] Initial Visit:  Patient Care Team: Pcp, No as PCP - General  CHIEF COMPLAINTS/PURPOSE OF CONSULTATION: Iron deficiency Anemia  Oncology History   No history exists.    HISTORY OF PRESENTING ILLNESS: Carrie Cameron 36 y.o. female is here because of iron deficiency anemia  May 10 2022:  Labs performed by gynecologist.  WBC 5.7 Hgb 7.3 MCV 58 PLT 405 Ferritin 2.4 Patient is G8 Q2595   Menarche at  Menopause not reached Menses occur every 14 days and last 3 days and are regular/irregular.  Bleeding is heavy on the first day  Changes pads/ tampons hourly for the first day.  Has bleeding between periods.    Does pass clots (a lot) Last pregnancy was delivered by NSVD in 2019 Underwent tubal ligation in 2019.  Has used Nexplanon, Depot and IUD   Has seen gynecology for consideration of hysterectomy.   Patient has uterine fibroids.   Has taken oral iron.  Has not tolerated oral iron owing to GI intolerance (constipation).   Has not received IV iron.  Received PRBC's in 2013   Does/ Does not partake in a normal diet Postpartum hemorrhage in 2004 requiring PRBC's (first delivery).  Required D and C following that delivery.      No hematochezia, melena, hemoptysis, hematuria.   No history of intra-articular or soft tissue bleeding No history of abnormal bleeding in family members Patient has symptoms of fatigue, pallor,  DOE walking, decreased performance status.  Patient has PICA to ice and has broken teeth   Intolerance to cold.  Difficulty concentrating.  Not taking NSAIDS   Review of Systems  Constitutional:  Positive for fatigue. Negative for appetite change, chills, fever and unexpected weight change.  HENT:   Negative for mouth sores, nosebleeds, sore throat and trouble swallowing.   Eyes:  Negative for eye problems and icterus.  Respiratory:  Positive for shortness of breath. Negative for chest tightness and cough.   Cardiovascular:  Negative  for chest pain, leg swelling and palpitations.  Gastrointestinal:  Negative for diarrhea, nausea and vomiting.       Abdominal discomfort with constipation   Genitourinary:  Positive for menstrual problem and vaginal bleeding. Negative for difficulty urinating, dysuria, frequency and hematuria.   Musculoskeletal:  Negative for arthralgias, back pain, flank pain, myalgias and neck pain.  Skin:  Negative for itching and rash.  Hematological:  Negative for adenopathy. Does not bruise/bleed easily.  Psychiatric/Behavioral:  Negative for depression, sleep disturbance and suicidal ideas. The patient is not nervous/anxious.     MEDICAL HISTORY: Past Medical History:  Diagnosis Date   Gestational diabetes    Pregnancy induced hypertension     SURGICAL HISTORY: Past Surgical History:  Procedure Laterality Date   BREAST ENHANCEMENT SURGERY Bilateral    TUBAL LIGATION N/A 05/23/2018   Procedure: BILATERAL TUBAL LIGATION;  Surgeon: Rubie Maid, MD;  Location: [facility];  Service: Gynecology;  Laterality: N/A;    SOCIAL HISTORY: Social History   Socioeconomic History   Marital status: Single    Spouse name: Not on file   Number of children: Not on file   Years of education: Not on file   Highest education level: Not on file  Occupational History   Not on file  Tobacco Use   Smoking status: Never   Smokeless tobacco: Never  Vaping Use   Vaping Use: Never used  Substance and Sexual Activity   Alcohol use: No   Drug use: No  Sexual activity: Yes    Birth control/protection: Surgical    Comment: Tubal Ligation  Other Topics Concern   Not on file  Social History Narrative   Not on file   Social Determinants of Health   Financial Resource Strain: Not on file  Food Insecurity: Not on file  Transportation Needs: Not on file  Physical Activity: Not on file  Stress: Not on file  Social Connections: Not on file  Intimate Partner Violence: Not on file    FAMILY  HISTORY Family History  Problem Relation Age of Onset   Diabetes Maternal Uncle     ALLERGIES:  has No Known Allergies.  MEDICATIONS:  Current Outpatient Medications  Medication Sig Dispense Refill   methocarbamol (ROBAXIN) 500 MG tablet Take 500 mg by mouth at bedtime as needed.     No current facility-administered medications for this visit.    PHYSICAL EXAMINATION:  ECOG PERFORMANCE STATUS: 0 - Asymptomatic   Vitals:   06/14/22 1007  BP: 123/81  Pulse: 91  Resp: 18  Temp: (!) 97.3 F (36.3 C)  SpO2: 100%    Filed Weights   06/14/22 1007  Weight: 145 lb 9.6 oz (66 kg)     Physical Exam Vitals and nursing note reviewed.  Constitutional:      General: She is not in acute distress.    Appearance: Normal appearance. She is not ill-appearing, toxic-appearing or diaphoretic.     Comments: Here alone.    HENT:     Head: Normocephalic and atraumatic.     Mouth/Throat:     Mouth: Mucous membranes are dry.     Pharynx: Oropharynx is clear. No oropharyngeal exudate or posterior oropharyngeal erythema.  Eyes:     General: No scleral icterus.    Extraocular Movements: Extraocular movements intact.     Conjunctiva/sclera: Conjunctivae normal.     Pupils: Pupils are equal, round, and reactive to light.  Neck:     Vascular: No carotid bruit.  Cardiovascular:     Rate and Rhythm: Normal rate and regular rhythm.     Heart sounds: No murmur heard.    No friction rub. No gallop.  Pulmonary:     Effort: Pulmonary effort is normal. No respiratory distress.     Breath sounds: Normal breath sounds. No wheezing, rhonchi or rales.  Chest:     Chest wall: No tenderness.  Abdominal:     General: There is no distension.     Palpations: Abdomen is soft. There is no mass.     Tenderness: There is no abdominal tenderness. There is no guarding.     Hernia: No hernia is present.  Musculoskeletal:        General: No swelling, tenderness, deformity or signs of injury. Normal range  of motion.     Cervical back: Normal range of motion and neck supple. No rigidity or tenderness.     Right lower leg: No edema.     Left lower leg: No edema.  Lymphadenopathy:     Cervical: No cervical adenopathy.  Skin:    General: Skin is warm.     Coloration: Skin is pale. Skin is not jaundiced.     Findings: No bruising, erythema or rash.  Neurological:     General: No focal deficit present.     Mental Status: She is alert and oriented to person, place, and time.     Cranial Nerves: No cranial nerve deficit.     Sensory: No sensory deficit.  Motor: No weakness.     Coordination: Coordination normal.     Gait: Gait normal.  Psychiatric:        Mood and Affect: Mood normal.        Behavior: Behavior normal.        Thought Content: Thought content normal.        Judgment: Judgment normal.      LABORATORY DATA: I have personally reviewed the data as listed:  No visits with results within 1 Month(s) from this visit.  Latest known visit with results is:  Procedure visit on 02/28/2022  Component Date Value Ref Range Status   Preg Test, Ur 02/28/2022 Negative  Negative Final    RADIOGRAPHIC STUDIES: I have personally reviewed the radiological images as listed and agree with the findings in the report  No results found.  ASSESSMENT/PLAN  Patient is a year old 54 female with symptomatic microcytic anemia presumed to be secondary to dysfunctional uterine bleeding Plan: Anemia:  Most likely etiology is iron deficiency anemia owing to dysfunctional uterine bleeding, exacerbated by high demand owing to prior pregnancies.  Will obtain CBC with diff, CMP, Ferritin, B12, folate, retic count,  DAT, Haptoglobin Dysfunctional uterine bleeding:  This is characterized by menses that are prolonged, irregular as well as by heavy bleeding with passage of clots.  Will evaluate for possible bleeding disorder with PT, PTT, Fibrinogen, von Willebrand screen.  Refer to gynecology for  evaluation and management  Pica:  Secondary to IDA.  Will resolve with resolution of IDA.    Therapeutics:  Since patient has symptomatic anemia with Hgb < 10  and has not tolerated/been compliant with oral iron will arrange for IV iron replacement.   Given the severe symptoms we prefer to replete iron stores in one or two visits rather than over the course of several months.  In addition ongoing blood loss exceeds the capacity of oral iron to meet needs. A discussion regarding risks was had with the patient.  IV iron has the potential to cause allergic reactions, including potentially life-threatening anaphylaxis.   IV iron may be associated with non-allergic infusion reactions including self-limiting urticaria, palpitations, dizziness, and neck and back spasm; generally, these occur in <1 percent of individuals and do not progress to more serious reactions. The non-allergic reaction consisting of flushing of the face and myalgias of the chest and back.   After discussion of the risks and benefits of IV iron therapy patient has elected to proceed with parenteral iron therapy.          Cancer Staging  No matching staging information was found for the patient.   No problem-specific Assessment & Plan notes found for this encounter.   No orders of the defined types were placed in this encounter.   All questions were answered. The patient knows to call the clinic with any problems, questions or concerns.  This note was electronically signed.    Barbee Cough, MD  06/14/2022 10:41 AM

## 2022-06-14 ENCOUNTER — Other Ambulatory Visit: Payer: Self-pay

## 2022-06-14 ENCOUNTER — Inpatient Hospital Stay: Payer: Managed Care, Other (non HMO)

## 2022-06-14 ENCOUNTER — Inpatient Hospital Stay: Payer: Managed Care, Other (non HMO) | Attending: Oncology | Admitting: Oncology

## 2022-06-14 DIAGNOSIS — F5089 Other specified eating disorder: Secondary | ICD-10-CM | POA: Diagnosis not present

## 2022-06-14 DIAGNOSIS — Z7189 Other specified counseling: Secondary | ICD-10-CM | POA: Insufficient documentation

## 2022-06-14 DIAGNOSIS — D500 Iron deficiency anemia secondary to blood loss (chronic): Secondary | ICD-10-CM | POA: Diagnosis not present

## 2022-06-14 DIAGNOSIS — N938 Other specified abnormal uterine and vaginal bleeding: Secondary | ICD-10-CM | POA: Insufficient documentation

## 2022-06-14 DIAGNOSIS — D649 Anemia, unspecified: Secondary | ICD-10-CM | POA: Insufficient documentation

## 2022-06-14 DIAGNOSIS — D509 Iron deficiency anemia, unspecified: Secondary | ICD-10-CM | POA: Diagnosis not present

## 2022-06-14 DIAGNOSIS — D259 Leiomyoma of uterus, unspecified: Secondary | ICD-10-CM | POA: Insufficient documentation

## 2022-06-14 DIAGNOSIS — D219 Benign neoplasm of connective and other soft tissue, unspecified: Secondary | ICD-10-CM | POA: Insufficient documentation

## 2022-06-14 LAB — CMP (CANCER CENTER ONLY)
ALT: 24 U/L (ref 0–44)
AST: 20 U/L (ref 15–41)
Albumin: 4.6 g/dL (ref 3.5–5.0)
Alkaline Phosphatase: 67 U/L (ref 38–126)
Anion gap: 6 (ref 5–15)
BUN: 8 mg/dL (ref 6–20)
CO2: 26 mmol/L (ref 22–32)
Calcium: 9.1 mg/dL (ref 8.9–10.3)
Chloride: 107 mmol/L (ref 98–111)
Creatinine: 0.38 mg/dL — ABNORMAL LOW (ref 0.44–1.00)
GFR, Estimated: >60 mL/min (ref >60)
Glucose, Bld: 86 mg/dL (ref 70–99)
Potassium: 3.6 mmol/L (ref 3.5–5.1)
Sodium: 139 mmol/L (ref 135–145)
Total Bilirubin: 0.4 mg/dL (ref 0.3–1.2)
Total Protein: 7.7 g/dL (ref 6.5–8.1)

## 2022-06-14 LAB — CBC WITH DIFFERENTIAL (CANCER CENTER ONLY)
Abs Immature Granulocytes: 0.01 10*3/uL (ref 0.00–0.07)
Basophils Absolute: 0 10*3/uL (ref 0.0–0.1)
Basophils Relative: 0 %
Eosinophils Absolute: 0 10*3/uL (ref 0.0–0.5)
Eosinophils Relative: 1 %
HCT: 25.3 % — ABNORMAL LOW (ref 36.0–46.0)
Hemoglobin: 7 g/dL — ABNORMAL LOW (ref 12.0–15.0)
Immature Granulocytes: 0 %
Lymphocytes Relative: 35 %
Lymphs Abs: 2.1 10*3/uL (ref 0.7–4.0)
MCH: 16.8 pg — ABNORMAL LOW (ref 26.0–34.0)
MCHC: 27.7 g/dL — ABNORMAL LOW (ref 30.0–36.0)
MCV: 60.8 fL — ABNORMAL LOW (ref 80.0–100.0)
Monocytes Absolute: 0.4 10*3/uL (ref 0.1–1.0)
Monocytes Relative: 7 %
Neutro Abs: 3.4 10*3/uL (ref 1.7–7.7)
Neutrophils Relative %: 57 %
Platelet Count: 374 10*3/uL (ref 150–400)
RBC: 4.16 MIL/uL (ref 3.87–5.11)
RDW: 20 % — ABNORMAL HIGH (ref 11.5–15.5)
Smear Review: ADEQUATE
WBC Count: 6 10*3/uL (ref 4.0–10.5)
nRBC: 0.3 % — ABNORMAL HIGH (ref 0.0–0.2)

## 2022-06-14 LAB — PROTIME-INR
INR: 1.1 (ref 0.8–1.2)
Prothrombin Time: 14.2 seconds (ref 11.4–15.2)

## 2022-06-14 LAB — DIRECT ANTIGLOBULIN TEST (NOT AT ARMC)
DAT, IgG: NEGATIVE
DAT, complement: NEGATIVE

## 2022-06-14 LAB — RETICULOCYTES
Immature Retic Fract: 16.6 % — ABNORMAL HIGH (ref 2.3–15.9)
RBC.: 4.04 MIL/uL (ref 3.87–5.11)
Retic Count, Absolute: 56.2 10*3/uL (ref 19.0–186.0)
Retic Ct Pct: 1.4 % (ref 0.4–3.1)

## 2022-06-14 LAB — VITAMIN B12: Vitamin B-12: 570 pg/mL (ref 180–914)

## 2022-06-14 LAB — APTT: aPTT: 28 seconds (ref 24–36)

## 2022-06-14 LAB — FOLATE: Folate: 14.3 ng/mL (ref >5.9)

## 2022-06-14 LAB — FERRITIN: Ferritin: 2 ng/mL — ABNORMAL LOW (ref 11–307)

## 2022-06-14 LAB — FIBRINOGEN: Fibrinogen: 284 mg/dL (ref 210–475)

## 2022-06-15 LAB — HAPTOGLOBIN: Haptoglobin: 124 mg/dL (ref 33–278)

## 2022-06-17 ENCOUNTER — Encounter: Payer: Self-pay | Admitting: Oncology

## 2022-06-17 ENCOUNTER — Telehealth: Payer: Self-pay | Admitting: Oncology

## 2022-06-17 LAB — HGB FRACTIONATION CASCADE
Hgb A2: 1.8 % (ref 1.8–3.2)
Hgb A: 98.2 % (ref 96.4–98.8)
Hgb F: 0 % (ref 0.0–2.0)
Hgb S: 0 %

## 2022-06-17 LAB — VON WILLEBRAND PANEL
Coagulation Factor VIII: 175 % — ABNORMAL HIGH (ref 56–140)
Ristocetin Co-factor, Plasma: 61 % (ref 50–200)
Von Willebrand Antigen, Plasma: 102 % (ref 50–200)

## 2022-06-17 LAB — COAG STUDIES INTERP REPORT

## 2022-06-17 NOTE — Telephone Encounter (Signed)
Scheduled appts per 8/25 los. Pt aware.

## 2022-06-21 ENCOUNTER — Other Ambulatory Visit: Payer: Self-pay

## 2022-06-21 ENCOUNTER — Inpatient Hospital Stay: Payer: Managed Care, Other (non HMO) | Attending: Oncology

## 2022-06-21 VITALS — BP 106/64 | HR 63 | Temp 98.5°F | Resp 17

## 2022-06-21 DIAGNOSIS — D509 Iron deficiency anemia, unspecified: Secondary | ICD-10-CM | POA: Insufficient documentation

## 2022-06-21 DIAGNOSIS — Z79899 Other long term (current) drug therapy: Secondary | ICD-10-CM | POA: Insufficient documentation

## 2022-06-21 DIAGNOSIS — D500 Iron deficiency anemia secondary to blood loss (chronic): Secondary | ICD-10-CM

## 2022-06-21 MED ORDER — SODIUM CHLORIDE 0.9 % IV SOLN: Freq: Once | INTRAVENOUS | Status: AC

## 2022-06-21 MED ORDER — SODIUM CHLORIDE 0.9 % IV SOLN
250.0000 mg | INTRAVENOUS | Status: DC
  Administered 2022-06-21: 250 mg via INTRAVENOUS
  Filled 2022-06-21: qty 20

## 2022-06-21 MED ORDER — CETIRIZINE HCL 10 MG/ML IV SOLN
10.0000 mg | Freq: Once | INTRAVENOUS | Status: DC
  Filled 2022-06-21: qty 1

## 2022-06-21 MED ORDER — LORATADINE 10 MG PO TABS
10.0000 mg | ORAL_TABLET | Freq: Every day | ORAL | Status: DC
  Administered 2022-06-21: 10 mg via ORAL
  Filled 2022-06-21: qty 1

## 2022-06-21 MED ORDER — ACETAMINOPHEN 325 MG PO TABS
650.0000 mg | ORAL_TABLET | Freq: Once | ORAL | Status: AC
  Administered 2022-06-21: 650 mg via ORAL
  Filled 2022-06-21: qty 2

## 2022-06-21 NOTE — Progress Notes (Signed)
Per Murray Hodgkins PA. Tylenol and zyrtec for premeds

## 2022-06-21 NOTE — Patient Instructions (Signed)
Sodium Ferric Gluconate Complex Injection What is this medication? SODIUM FERRIC GLUCONATE COMPLEX (SOE dee um FER ik GLOO koe nate KOM pleks) treats low levels of iron (iron deficiency anemia) in people with kidney disease. Iron is a mineral that plays an important role in making red blood cells, which carry oxygen from your lungs to the rest of your body. This medicine may be used for other purposes; ask your health care provider or pharmacist if you have questions. COMMON BRAND NAME(S): Ferrlecit, Nulecit What should I tell my care team before I take this medication? They need to know if you have any of the following conditions: Anemia that is not from iron deficiency High levels of iron in the blood An unusual or allergic reaction to iron, other medications, foods, dyes, or preservatives Pregnant or are trying to become pregnant Breast-feeding How should I use this medication? This medication is injected into a vein. It is given by your care team in a hospital or clinic setting. Talk to your care team about the use of this medication in children. While it may be prescribed for children as young as 6 years for selected conditions, precautions do apply. Overdosage: If you think you have taken too much of this medicine contact a poison control center or emergency room at once. NOTE: This medicine is only for you. Do not share this medicine with others. What if I miss a dose? It is important not to miss your dose. Call your care team if you are unable to keep an appointment. What may interact with this medication? Do not take this medication with any of the following: Deferasirox Deferoxamine Dimercaprol This medication may also interact with the following: Other iron products This list may not describe all possible interactions. Give your health care provider a list of all the medicines, herbs, non-prescription drugs, or dietary supplements you use. Also tell them if you smoke, drink  alcohol, or use illegal drugs. Some items may interact with your medicine. What should I watch for while using this medication? Your condition will be monitored carefully while you are receiving this medication. Visit your care team for regular checks on your progress. You may need blood work while you are taking this medication. What side effects may I notice from receiving this medication? Side effects that you should report to your care team as soon as possible: Allergic reactions--skin rash, itching, hives, swelling of the face, lips, tongue, or throat Low blood pressure--dizziness, feeling faint or lightheaded, blurry vision Shortness of breath Side effects that usually do not require medical attention (report to your care team if they continue or are bothersome): Flushing Headache Joint pain Muscle pain Nausea Pain, redness, or irritation at injection site This list may not describe all possible side effects. Call your doctor for medical advice about side effects. You may report side effects to FDA at 1-800-FDA-1088. Where should I keep my medication? This medication is given in a hospital or clinic and will not be stored at home. NOTE: This sheet is a summary. It may not cover all possible information. If you have questions about this medicine, talk to your doctor, pharmacist, or health care provider.  2023 Elsevier/Gold Standard (2021-03-02 00:00:00)  

## 2022-06-28 ENCOUNTER — Other Ambulatory Visit: Payer: Self-pay

## 2022-06-28 ENCOUNTER — Inpatient Hospital Stay: Payer: Managed Care, Other (non HMO)

## 2022-06-28 VITALS — BP 113/79 | HR 70 | Temp 98.1°F | Resp 18

## 2022-06-28 DIAGNOSIS — D500 Iron deficiency anemia secondary to blood loss (chronic): Secondary | ICD-10-CM

## 2022-06-28 DIAGNOSIS — D509 Iron deficiency anemia, unspecified: Secondary | ICD-10-CM | POA: Diagnosis not present

## 2022-06-28 MED ORDER — SODIUM CHLORIDE 0.9 % IV SOLN: Freq: Once | INTRAVENOUS | Status: AC

## 2022-06-28 MED ORDER — SODIUM CHLORIDE 0.9 % IV SOLN
250.0000 mg | INTRAVENOUS | Status: DC
  Administered 2022-06-28: 250 mg via INTRAVENOUS
  Filled 2022-06-28: qty 20

## 2022-06-28 MED ORDER — ACETAMINOPHEN 325 MG PO TABS
650.0000 mg | ORAL_TABLET | Freq: Once | ORAL | Status: AC
  Administered 2022-06-28: 650 mg via ORAL
  Filled 2022-06-28: qty 2

## 2022-06-28 MED ORDER — LORATADINE 10 MG PO TABS
10.0000 mg | ORAL_TABLET | Freq: Every day | ORAL | Status: DC
  Administered 2022-06-28: 10 mg via ORAL
  Filled 2022-06-28: qty 1

## 2022-06-28 NOTE — Progress Notes (Signed)
Patient declined to stay 30 minute post iron observation. VSS, no signs of distress noted upon discharge.

## 2022-06-28 NOTE — Patient Instructions (Signed)
Sodium Ferric Gluconate Complex Injection What is this medication? SODIUM FERRIC GLUCONATE COMPLEX (SOE dee um FER ik GLOO koe nate KOM pleks) treats low levels of iron (iron deficiency anemia) in people with kidney disease. Iron is a mineral that plays an important role in making red blood cells, which carry oxygen from your lungs to the rest of your body. This medicine may be used for other purposes; ask your health care provider or pharmacist if you have questions. COMMON BRAND NAME(S): Ferrlecit, Nulecit What should I tell my care team before I take this medication? They need to know if you have any of the following conditions: Anemia that is not from iron deficiency High levels of iron in the blood An unusual or allergic reaction to iron, other medications, foods, dyes, or preservatives Pregnant or are trying to become pregnant Breast-feeding How should I use this medication? This medication is injected into a vein. It is given by your care team in a hospital or clinic setting. Talk to your care team about the use of this medication in children. While it may be prescribed for children as young as 6 years for selected conditions, precautions do apply. Overdosage: If you think you have taken too much of this medicine contact a poison control center or emergency room at once. NOTE: This medicine is only for you. Do not share this medicine with others. What if I miss a dose? It is important not to miss your dose. Call your care team if you are unable to keep an appointment. What may interact with this medication? Do not take this medication with any of the following: Deferasirox Deferoxamine Dimercaprol This medication may also interact with the following: Other iron products This list may not describe all possible interactions. Give your health care provider a list of all the medicines, herbs, non-prescription drugs, or dietary supplements you use. Also tell them if you smoke, drink  alcohol, or use illegal drugs. Some items may interact with your medicine. What should I watch for while using this medication? Your condition will be monitored carefully while you are receiving this medication. Visit your care team for regular checks on your progress. You may need blood work while you are taking this medication. What side effects may I notice from receiving this medication? Side effects that you should report to your care team as soon as possible: Allergic reactions--skin rash, itching, hives, swelling of the face, lips, tongue, or throat Low blood pressure--dizziness, feeling faint or lightheaded, blurry vision Shortness of breath Side effects that usually do not require medical attention (report to your care team if they continue or are bothersome): Flushing Headache Joint pain Muscle pain Nausea Pain, redness, or irritation at injection site This list may not describe all possible side effects. Call your doctor for medical advice about side effects. You may report side effects to FDA at 1-800-FDA-1088. Where should I keep my medication? This medication is given in a hospital or clinic and will not be stored at home. NOTE: This sheet is a summary. It may not cover all possible information. If you have questions about this medicine, talk to your doctor, pharmacist, or health care provider.  2023 Elsevier/Gold Standard (2021-03-02 00:00:00)  

## 2022-07-05 ENCOUNTER — Inpatient Hospital Stay: Payer: Managed Care, Other (non HMO)

## 2022-07-05 ENCOUNTER — Other Ambulatory Visit: Payer: Self-pay

## 2022-07-05 VITALS — BP 111/69 | HR 61 | Temp 98.2°F | Resp 16

## 2022-07-05 DIAGNOSIS — D509 Iron deficiency anemia, unspecified: Secondary | ICD-10-CM | POA: Diagnosis not present

## 2022-07-05 DIAGNOSIS — D500 Iron deficiency anemia secondary to blood loss (chronic): Secondary | ICD-10-CM

## 2022-07-05 MED ORDER — SODIUM CHLORIDE 0.9 % IV SOLN: Freq: Once | INTRAVENOUS | Status: AC

## 2022-07-05 MED ORDER — SODIUM CHLORIDE 0.9 % IV SOLN
250.0000 mg | INTRAVENOUS | Status: DC
  Administered 2022-07-05: 250 mg via INTRAVENOUS
  Filled 2022-07-05: qty 20

## 2022-07-05 MED ORDER — ACETAMINOPHEN 325 MG PO TABS
650.0000 mg | ORAL_TABLET | Freq: Once | ORAL | Status: AC
  Administered 2022-07-05: 650 mg via ORAL
  Filled 2022-07-05: qty 2

## 2022-07-05 MED ORDER — LORATADINE 10 MG PO TABS
10.0000 mg | ORAL_TABLET | Freq: Every day | ORAL | Status: DC
  Administered 2022-07-05: 10 mg via ORAL
  Filled 2022-07-05: qty 1

## 2022-07-05 NOTE — Patient Instructions (Signed)

## 2022-07-05 NOTE — Progress Notes (Signed)
Patient declined to stay for 30 minute post-iron observation. Vitals stable, no complaints noted. Ambulatory to lobby.

## 2022-07-12 ENCOUNTER — Encounter (HOSPITAL_COMMUNITY): Payer: Self-pay

## 2022-07-12 ENCOUNTER — Emergency Department (HOSPITAL_COMMUNITY)
Admission: EM | Admit: 2022-07-12 | Discharge: 2022-07-12 | Disposition: A | Discharge status: Home / Self Care | Payer: Managed Care, Other (non HMO) | Attending: Emergency Medicine | Admitting: Emergency Medicine

## 2022-07-12 ENCOUNTER — Inpatient Hospital Stay: Payer: Managed Care, Other (non HMO)

## 2022-07-12 ENCOUNTER — Inpatient Hospital Stay (HOSPITAL_BASED_OUTPATIENT_CLINIC_OR_DEPARTMENT_OTHER): Payer: Managed Care, Other (non HMO) | Admitting: Physician Assistant

## 2022-07-12 ENCOUNTER — Other Ambulatory Visit: Payer: Self-pay

## 2022-07-12 VITALS — BP 122/80 | HR 77 | Resp 18

## 2022-07-12 DIAGNOSIS — T454X5A Adverse effect of iron and its compounds, initial encounter: Secondary | ICD-10-CM | POA: Diagnosis not present

## 2022-07-12 DIAGNOSIS — T7840XA Allergy, unspecified, initial encounter: Secondary | ICD-10-CM | POA: Diagnosis present

## 2022-07-12 DIAGNOSIS — D500 Iron deficiency anemia secondary to blood loss (chronic): Secondary | ICD-10-CM

## 2022-07-12 LAB — CBC WITH DIFFERENTIAL/PLATELET
Abs Immature Granulocytes: 0.02 10*3/uL (ref 0.00–0.07)
Basophils Absolute: 0 10*3/uL (ref 0.0–0.1)
Basophils Relative: 0 %
Eosinophils Absolute: 0.1 10*3/uL (ref 0.0–0.5)
Eosinophils Relative: 1 %
HCT: 43.7 % (ref 36.0–46.0)
Hemoglobin: 12.5 g/dL (ref 12.0–15.0)
Immature Granulocytes: 0 %
Lymphocytes Relative: 51 %
Lymphs Abs: 4.2 10*3/uL — ABNORMAL HIGH (ref 0.7–4.0)
MCH: 22 pg — ABNORMAL LOW (ref 26.0–34.0)
MCHC: 28.6 g/dL — ABNORMAL LOW (ref 30.0–36.0)
MCV: 76.8 fL — ABNORMAL LOW (ref 80.0–100.0)
Monocytes Absolute: 0.4 10*3/uL (ref 0.1–1.0)
Monocytes Relative: 5 %
Neutro Abs: 3.5 10*3/uL (ref 1.7–7.7)
Neutrophils Relative %: 43 %
Platelets: 329 10*3/uL (ref 150–400)
RBC: 5.69 MIL/uL — ABNORMAL HIGH (ref 3.87–5.11)
WBC: 8.2 10*3/uL (ref 4.0–10.5)
nRBC: 0 % (ref 0.0–0.2)

## 2022-07-12 LAB — BASIC METABOLIC PANEL
Anion gap: 7 (ref 5–15)
BUN: 7 mg/dL (ref 6–20)
CO2: 20 mmol/L — ABNORMAL LOW (ref 22–32)
Calcium: 8.8 mg/dL — ABNORMAL LOW (ref 8.9–10.3)
Chloride: 114 mmol/L — ABNORMAL HIGH (ref 98–111)
Creatinine, Ser: 0.48 mg/dL (ref 0.44–1.00)
GFR, Estimated: >60 mL/min (ref >60)
Glucose, Bld: 103 mg/dL — ABNORMAL HIGH (ref 70–99)
Potassium: 3.8 mmol/L (ref 3.5–5.1)
Sodium: 141 mmol/L (ref 135–145)

## 2022-07-12 MED ORDER — LORATADINE 10 MG PO TABS
10.0000 mg | ORAL_TABLET | Freq: Every day | ORAL | Status: DC
  Administered 2022-07-12: 10 mg via ORAL
  Filled 2022-07-12: qty 1

## 2022-07-12 MED ORDER — METHYLPREDNISOLONE SODIUM SUCC 125 MG IJ SOLR
125.0000 mg | Freq: Once | INTRAMUSCULAR | Status: AC | PRN
  Administered 2022-07-12: 125 mg via INTRAVENOUS

## 2022-07-12 MED ORDER — ACETAMINOPHEN 325 MG PO TABS
650.0000 mg | ORAL_TABLET | Freq: Once | ORAL | Status: AC
  Administered 2022-07-12: 650 mg via ORAL
  Filled 2022-07-12: qty 2

## 2022-07-12 MED ORDER — ACETAMINOPHEN 325 MG PO TABS
325.0000 mg | ORAL_TABLET | Freq: Once | ORAL | Status: AC
  Administered 2022-07-12: 325 mg via ORAL
  Filled 2022-07-12: qty 1

## 2022-07-12 MED ORDER — DIPHENHYDRAMINE HCL 50 MG/ML IJ SOLN
50.0000 mg | Freq: Once | INTRAMUSCULAR | Status: AC | PRN
  Administered 2022-07-12: 50 mg via INTRAVENOUS

## 2022-07-12 MED ORDER — SODIUM CHLORIDE 0.9 % IV SOLN
250.0000 mg | Freq: Once | INTRAVENOUS | Status: AC
  Administered 2022-07-12: 250 mg via INTRAVENOUS
  Filled 2022-07-12: qty 20

## 2022-07-12 MED ORDER — SODIUM CHLORIDE 0.9 % IV SOLN: Freq: Once | INTRAVENOUS | Status: AC

## 2022-07-12 MED ORDER — FAMOTIDINE IN NACL 20-0.9 MG/50ML-% IV SOLN
20.0000 mg | Freq: Once | INTRAVENOUS | Status: AC | PRN
  Administered 2022-07-12: 20 mg via INTRAVENOUS

## 2022-07-12 MED ORDER — SODIUM CHLORIDE 0.9 % IV SOLN: Freq: Once | INTRAVENOUS | Status: DC | PRN

## 2022-07-12 NOTE — ED Provider Notes (Signed)
[facility] DEPT Provider Note   CSN: [id number] Arrival date & time: 07/12/22  1136     History  Chief Complaint  Patient presents with   Allergic Reaction    Carrie Cameron is a 36 y.o. female.  Patient presents to the emergency department from the cancer center where she was getting iron infusion.  At the end of her infusion the patient felt short of breath, felt like her throat was closing, and was unable to feel her bilateral arms.  She was administered 125 mg of Solu-Medrol, 20 mg of Pepcid, 50 mg of Benadryl, 10 mg of loratadine, and 975 mg of Tylenol prior to arriving in the emergency department.  Upon arrival she states that she feels that she is breathing okay and is able to speak in full sentences.  Past medical history significant for iron deficiency anemia requiring iron transfusions  HPI     Home Medications Prior to Admission medications   Medication Sig Start Date End Date Taking? Authorizing Provider  methocarbamol (ROBAXIN) 500 MG tablet Take 500 mg by mouth at bedtime as needed. 05/23/22   [provider]      Allergies    Ferrlecit [na ferric gluc cplx in sucrose]    Review of Systems   Review of Systems  Respiratory:  Positive for shortness of breath.   Musculoskeletal:        Hand swelling    Physical Exam Updated Vital Signs BP 108/76   Pulse 65   Temp 98.6 F (37 C) (Oral)   Resp 17   SpO2 98%  Physical Exam Vitals and nursing note reviewed.  Constitutional:      General: She is not in acute distress. HENT:     Head: Normocephalic and atraumatic.     Mouth/Throat:     Mouth: Mucous membranes are moist. No angioedema.     Pharynx: Uvula midline. No uvula swelling.     Comments: No oropharyngeal swelling noted.  Airway is patent Cardiovascular:     Rate and Rhythm: Normal rate and regular rhythm.     Pulses: Normal pulses.     Heart sounds: Normal heart sounds.  Pulmonary:     Effort:  Pulmonary effort is normal. No respiratory distress.     Breath sounds: Normal breath sounds.  Abdominal:     Palpations: Abdomen is soft.     Tenderness: There is no abdominal tenderness.  Musculoskeletal:        General: Normal range of motion.     Cervical back: Normal range of motion and neck supple.  Skin:    General: Skin is warm and dry.     Capillary Refill: Capillary refill takes less than 2 seconds.  Neurological:     Mental Status: She is alert.     Sensory: No sensory deficit.     Motor: No weakness.     ED Results / Procedures / Treatments   Labs (all labs ordered are listed, but only abnormal results are displayed) Labs Reviewed  BASIC METABOLIC PANEL - Abnormal; Notable for the following components:      Result Value   Chloride 114 (*)    CO2 20 (*)    Glucose, Bld 103 (*)    Calcium 8.8 (*)    All other components within normal limits  CBC WITH DIFFERENTIAL/PLATELET - Abnormal; Notable for the following components:   RBC 5.69 (*)    MCV 76.8 (*)    MCH 22.0 (*)  MCHC 28.6 (*)    Lymphs Abs 4.2 (*)    All other components within normal limits    EKG None  Radiology No results found.  Procedures Procedures    Medications Ordered in ED Medications - No data to display  ED Course/ Medical Decision Making/ A&P                           Medical Decision Making Amount and/or Complexity of Data Reviewed Labs: ordered.   Patient presents to the emergency department secondary to an allergic reaction to an iron infusion.  Reviewed the patient's past medical history and see that this is her fourth infusion but first time reaction.  I ordered and reviewed labs.  Hemoglobin 12.5.  Unremarkable BMP.  I considered imaging but saw no utility as patient had no respiratory distress and had clear lung fields.  The patient was administered medication prior to arrival in the emergency department.  She was observed while in our department.  Vitals have  remained normal.  She has no complaints of respiratory distress at this time.  She complains of mild swelling in bilateral hands this states she is ready to discharge.  No epinephrine was administered which would require further observation.  The patient appears reasonably stable.  She may discharge home at this time.        Final Clinical Impression(s) / ED Diagnoses Final diagnoses:  Allergic reaction, initial encounter    Rx / DC Orders ED Discharge Orders     None         Ronny Bacon 07/12/22 1356    Sherwood Gambler, MD 07/15/22 [phone]

## 2022-07-12 NOTE — Discharge Instructions (Signed)
You were seen today after an adverse reaction to an iron infusion.  You appear stable at this time for discharge.  I recommend that you discuss this most recent reaction with your hematologist to discuss future options.  If you develop any shortness of breath or other life-threatening conditions please return to the emergency department.

## 2022-07-12 NOTE — Progress Notes (Signed)
Carrie Cameron 36 y.o. is a female seen in the infusion center for an iron reaction. Patient here for 4th time Ferrlecit infusion. She had 5 minutes remaining and when she developed bilateral hand pain and burning. She then started to experience sensation of throat and tongue swelling. She had no visual facial swelling and airway was intact. She was given premedication of tylenol 650 mg and Claritin 10 mg. Emergency medications were given to include tylenol 325 mg, pepcid '20mg'$ , benadryl 50 mg, solumedrol 125 mg and 1 L IVF. She was normotensive and placed on 2L [place] for comfort.   Patient's symptoms continued to worsen and she was taken to the ED by myself and RN. Report given to ED PA and RN. Patient's hematologist Dr. Federico Flake was made aware.   T: 98.6   BP: 128/80  HR: 80 O2: 100% 2L 

## 2022-07-12 NOTE — Patient Instructions (Signed)
Sodium Ferric Gluconate Complex Injection What is this medication? SODIUM FERRIC GLUCONATE COMPLEX (SOE dee um FER ik GLOO koe nate KOM pleks) treats low levels of iron (iron deficiency anemia) in people with kidney disease. Iron is a mineral that plays an important role in making red blood cells, which carry oxygen from your lungs to the rest of your body. This medicine may be used for other purposes; ask your health care provider or pharmacist if you have questions. COMMON BRAND NAME(S): Ferrlecit, Nulecit What should I tell my care team before I take this medication? They need to know if you have any of the following conditions: Anemia that is not from iron deficiency High levels of iron in the blood An unusual or allergic reaction to iron, other medications, foods, dyes, or preservatives Pregnant or are trying to become pregnant Breast-feeding How should I use this medication? This medication is injected into a vein. It is given by your care team in a hospital or clinic setting. Talk to your care team about the use of this medication in children. While it may be prescribed for children as young as 6 years for selected conditions, precautions do apply. Overdosage: If you think you have taken too much of this medicine contact a poison control center or emergency room at once. NOTE: This medicine is only for you. Do not share this medicine with others. What if I miss a dose? It is important not to miss your dose. Call your care team if you are unable to keep an appointment. What may interact with this medication? Do not take this medication with any of the following: Deferasirox Deferoxamine Dimercaprol This medication may also interact with the following: Other iron products This list may not describe all possible interactions. Give your health care provider a list of all the medicines, herbs, non-prescription drugs, or dietary supplements you use. Also tell them if you smoke, drink  alcohol, or use illegal drugs. Some items may interact with your medicine. What should I watch for while using this medication? Your condition will be monitored carefully while you are receiving this medication. Visit your care team for regular checks on your progress. You may need blood work while you are taking this medication. What side effects may I notice from receiving this medication? Side effects that you should report to your care team as soon as possible: Allergic reactions--skin rash, itching, hives, swelling of the face, lips, tongue, or throat Low blood pressure--dizziness, feeling faint or lightheaded, blurry vision Shortness of breath Side effects that usually do not require medical attention (report to your care team if they continue or are bothersome): Flushing Headache Joint pain Muscle pain Nausea Pain, redness, or irritation at injection site This list may not describe all possible side effects. Call your doctor for medical advice about side effects. You may report side effects to FDA at 1-800-FDA-1088. Where should I keep my medication? This medication is given in a hospital or clinic and will not be stored at home. NOTE: This sheet is a summary. It may not cover all possible information. If you have questions about this medicine, talk to your doctor, pharmacist, or health care provider.  2023 Elsevier/Gold Standard (2021-03-02 00:00:00)  

## 2022-07-12 NOTE — Progress Notes (Signed)
Hypersensitivity Reaction note  Date of event: 07/12/22 Time of event: 1058 Generic name of drug involved: ferric gluconate Name of provider notified of the hypersensitivity reaction: Eloise Harman PA-C Was agent that likely caused hypersensitivity reaction added to Allergies List within EMR? YES Chain of events including reaction signs/symptoms, treatment administered, and outcome (e.g., drug resumed; drug discontinued; sent to Emergency Department; etc.) Approximately 5 minutes prior to end of Ferrlecit infusion, pt began to complain of right arm itching and burning. Infusion immediately stopped and IV fluid bolus started. RN notified Eloise Harman PA-C. Pepcid, Solumedrol, Benadryl, and Tylenol given as ordered (See MAR for infusion times). Patient began to complain of numbness in both hands and is noted to have significant swelling of bilateral upper extremities. Eloise Harman PA-C remains at chairside. O2 applied for comfort as patient stated that she felt short of breath. Continued to observe patient with Anda Kraft at chairside. Swelling noted to be worsening and patient's face, including her lips began to swell. She reported she felt like her tongue was enlarging. Hives noted across arms and legs and noted patient to be mildly shaky. RN transferred patient to wheelchair and expedited transfer to ED with Earle Gell present during the entire transport. Patient remained alert and oriented throughout this process with stable vital signs (please see flowheets for vital sign documentation). Upon arrival to ED at 11:37a, care was transferred to [place], [place]. All of patient's jewelery on hands were removed prior to transfer and placed in belongings bag that had an affixed patient label. Patient's cell phone and other belongings were also in this bag. Bag was placed in ED room with patient.   Clyda Hurdle, RN 07/12/2022 12:08 PM

## 2022-07-12 NOTE — ED Triage Notes (Signed)
Pt arrived via wheelchair from [facility]. Was receiving iron infusion, upon completion of infusion. Pt became feeling SOB, feeling of throat closure, unable to feel bilateral arms. Air way patent in triage, spo2  WNL, able to handle sections.   Given '125mg'$  solumedrol IV Pepcid 20 mg IV Benadryl 50 mg IV Loratadine 10 mg PO Tylenol 975 mg PO PTA

## 2022-07-16 NOTE — Progress Notes (Deleted)
[facility] Initial Visit:  Patient Care Team: Pcp, No as PCP - General Barbee Cough, MD as Attending Physician (Hematology)  CHIEF COMPLAINTS/PURPOSE OF CONSULTATION: Iron deficiency Anemia  Oncology History   No history exists.    HISTORY OF PRESENTING ILLNESS: Carrie Cameron 36 y.o. female is here because of iron deficiency anemia  May 10 2022:  Labs performed by gynecologist.  WBC 5.7 Hgb 7.3 MCV 58 PLT 405 Ferritin 2.4  June 14 2022:  [facility] Consult  Patient is G8 [phone]  Menopause not reached Menses occur every 14 days and last 3 days and are regular/irregular.  Bleeding is heavy on the first day  Changes pads/ tampons hourly for the first day.  Has bleeding between periods.    Does pass clots (a lot) Last pregnancy was delivered by NSVD in 2019 Underwent tubal ligation in 2019.  Has used Nexplanon, Depot and IUD   Has seen gynecology for consideration of hysterectomy.   Patient has uterine fibroids.   Has taken oral iron.  Has not tolerated oral iron owing to GI intolerance (constipation).   Has not received IV iron.  Received PRBC's in 2013   Does/ Does not partake in a normal diet Postpartum hemorrhage in 2004 requiring PRBC's (first delivery).  Required D and C following that delivery.      No hematochezia, melena, hemoptysis, hematuria.   No history of intra-articular or soft tissue bleeding No history of abnormal bleeding in family members Patient has symptoms of fatigue, pallor,  DOE walking, decreased performance status.  Patient has PICA to ice and has broken teeth   Intolerance to cold.  Difficulty concentrating.  Not taking NSAIDS WBC 6.0 hemoglobin 7.0 MCV 61 platelet count 374.  Differential normal.  Reticulocyte count 1.4%.  CMP notable for creatinine of 0.38.  Ferritin 2.  B12 570 folate 14.3 haptoglobin 124.  Hemoglobin electrophoresis normal adult pattern.  INR 1.1 PTT 29 direct Coombs test negative Cholebrine  panel showed Willebrand antigen 102 ristocetin cofactor 61 coagulation factor VIII 0.75  June 21 2022: Ferric gluconate 250 mg IV administered without incident June 28 2022:  Ferric gluconate 250 mg IV administered without incident July 05 2022:  Ferric gluconate 250 mg IV administered without incident July 12 2022:  Ferric gluconate this duration begun but halted due to reaction.  Was later found that there was an issue with the lot room which treatment was aliquoted  July 19, 2022: Scheduled follow-up management of iron deficiency anemia  Review of Systems  Constitutional:  Positive for fatigue. Negative for appetite change, chills, fever and unexpected weight change.  HENT:   Negative for mouth sores, nosebleeds, sore throat and trouble swallowing.   Eyes:  Negative for eye problems and icterus.  Respiratory:  Positive for shortness of breath. Negative for chest tightness and cough.   Cardiovascular:  Negative for chest pain, leg swelling and palpitations.  Gastrointestinal:  Negative for diarrhea, nausea and vomiting.       Abdominal discomfort with constipation   Genitourinary:  Positive for menstrual problem and vaginal bleeding. Negative for difficulty urinating, dysuria, frequency and hematuria.   Musculoskeletal:  Negative for arthralgias, back pain, flank pain, myalgias and neck pain.  Skin:  Negative for itching and rash.  Hematological:  Negative for adenopathy. Does not bruise/bleed easily.  Psychiatric/Behavioral:  Negative for depression, sleep disturbance and suicidal ideas. The patient is not nervous/anxious.     MEDICAL HISTORY: Past Medical History:  Diagnosis Date   Gestational diabetes    Pregnancy induced hypertension     SURGICAL HISTORY: Past Surgical History:  Procedure Laterality Date   BREAST ENHANCEMENT SURGERY Bilateral    TUBAL LIGATION N/A 05/23/2018   Procedure: BILATERAL TUBAL LIGATION;  Surgeon: Rubie Maid, MD;   Location: [facility] ORS;  Service: Gynecology;  Laterality: N/A;    SOCIAL HISTORY: Social History   Socioeconomic History   Marital status: Single    Spouse name: Not on file   Number of children: Not on file   Years of education: Not on file   Highest education level: Not on file  Occupational History   Not on file  Tobacco Use   Smoking status: Never   Smokeless tobacco: Never  Vaping Use   Vaping Use: Never used  Substance and Sexual Activity   Alcohol use: No   Drug use: No   Sexual activity: Yes    Birth control/protection: Surgical    Comment: Tubal Ligation  Other Topics Concern   Not on file  Social History Narrative   Not on file   Social Determinants of Health   Financial Resource Strain: Not on file  Food Insecurity: Not on file  Transportation Needs: Not on file  Physical Activity: Not on file  Stress: Not on file  Social Connections: Not on file  Intimate Partner Violence: Not on file    FAMILY HISTORY Family History  Problem Relation Age of Onset   Diabetes Maternal Uncle     ALLERGIES:  is allergic to ferrlecit [na ferric gluc cplx in sucrose].  MEDICATIONS:  Current Outpatient Medications  Medication Sig Dispense Refill   methocarbamol (ROBAXIN) 500 MG tablet Take 500 mg by mouth at bedtime as needed.     No current facility-administered medications for this visit.    PHYSICAL EXAMINATION:  ECOG PERFORMANCE STATUS: 0 - Asymptomatic   There were no vitals filed for this visit.   There were no vitals filed for this visit.    Physical Exam Vitals and nursing note reviewed.  Constitutional:      General: She is not in acute distress.    Appearance: Normal appearance. She is not ill-appearing, toxic-appearing or diaphoretic.     Comments: Here alone.    HENT:     Head: Normocephalic and atraumatic.     Mouth/Throat:     Mouth: Mucous membranes are dry.     Pharynx: Oropharynx is clear. No oropharyngeal exudate or posterior  oropharyngeal erythema.  Eyes:     General: No scleral icterus.    Extraocular Movements: Extraocular movements intact.     Conjunctiva/sclera: Conjunctivae normal.     Pupils: Pupils are equal, round, and reactive to light.  Neck:     Vascular: No carotid bruit.  Cardiovascular:     Rate and Rhythm: Normal rate and regular rhythm.     Heart sounds: No murmur heard.    No friction rub. No gallop.  Pulmonary:     Effort: Pulmonary effort is normal. No respiratory distress.     Breath sounds: Normal breath sounds. No wheezing, rhonchi or rales.  Chest:     Chest wall: No tenderness.  Abdominal:     General: There is no distension.     Palpations: Abdomen is soft. There is no mass.     Tenderness: There is no abdominal tenderness. There is no guarding.     Hernia: No hernia is present.  Musculoskeletal:  General: No swelling, tenderness, deformity or signs of injury. Normal range of motion.     Cervical back: Normal range of motion and neck supple. No rigidity or tenderness.     Right lower leg: No edema.     Left lower leg: No edema.  Lymphadenopathy:     Cervical: No cervical adenopathy.  Skin:    General: Skin is warm.     Coloration: Skin is pale. Skin is not jaundiced.     Findings: No bruising, erythema or rash.  Neurological:     General: No focal deficit present.     Mental Status: She is alert and oriented to person, place, and time.     Cranial Nerves: No cranial nerve deficit.     Sensory: No sensory deficit.     Motor: No weakness.     Coordination: Coordination normal.     Gait: Gait normal.  Psychiatric:        Mood and Affect: Mood normal.        Behavior: Behavior normal.        Thought Content: Thought content normal.        Judgment: Judgment normal.     LABORATORY DATA: I have personally reviewed the data as listed:  Admission on 07/12/2022, Discharged on 07/12/2022  Component Date Value Ref Range Status   Sodium 07/12/2022 141  135 - 145  mmol/L Final   Potassium 07/12/2022 3.8  3.5 - 5.1 mmol/L Final   Chloride 07/12/2022 114 (H)  98 - 111 mmol/L Final   CO2 07/12/2022 20 (L)  22 - 32 mmol/L Final   Glucose, Bld 07/12/2022 103 (H)  70 - 99 mg/dL Final   Glucose reference range applies only to samples taken after fasting for at least 8 hours.   BUN 07/12/2022 7  6 - 20 mg/dL Final   Creatinine, Ser 07/12/2022 0.48  0.44 - 1.00 mg/dL Final   Calcium 07/12/2022 8.8 (L)  8.9 - 10.3 mg/dL Final   GFR, Estimated 07/12/2022 >60  >60 mL/min Final   Comment: (NOTE) Calculated using the CKD-EPI Creatinine Equation (2021)    Anion gap 07/12/2022 7  5 - 15 Final   Performed at [facility], [place] [address] [place], [place] [address]   WBC 07/12/2022 8.2  4.0 - 10.5 K/uL Final   RBC 07/12/2022 5.69 (H)  3.87 - 5.11 MIL/uL Final   Hemoglobin 07/12/2022 12.5  12.0 - 15.0 g/dL Final   HCT 07/12/2022 43.7  36.0 - 46.0 % Final   MCV 07/12/2022 76.8 (L)  80.0 - 100.0 fL Final   MCH 07/12/2022 22.0 (L)  26.0 - 34.0 pg Final   MCHC 07/12/2022 28.6 (L)  30.0 - 36.0 g/dL Final   RDW 07/12/2022 Not Measured  11.5 - 15.5 % Final   Platelets 07/12/2022 329  150 - 400 K/uL Final   nRBC 07/12/2022 0.0  0.0 - 0.2 % Final   Neutrophils Relative % 07/12/2022 43  % Final   Neutro Abs 07/12/2022 3.5  1.7 - 7.7 K/uL Final   Lymphocytes Relative 07/12/2022 51  % Final   Lymphs Abs 07/12/2022 4.2 (H)  0.7 - 4.0 K/uL Final   Monocytes Relative 07/12/2022 5  % Final   Monocytes Absolute 07/12/2022 0.4  0.1 - 1.0 K/uL Final   Eosinophils Relative 07/12/2022 1  % Final   Eosinophils Absolute 07/12/2022 0.1  0.0 - 0.5 K/uL Final   Basophils Relative 07/12/2022 0  % Final  Basophils Absolute 07/12/2022 0.0  0.0 - 0.1 K/uL Final   Immature Granulocytes 07/12/2022 0  % Final   Abs Immature Granulocytes 07/12/2022 0.02  0.00 - 0.07 K/uL Final   Polychromasia 07/12/2022 PRESENT   Final   Ovalocytes 07/12/2022 PRESENT   Final   Performed  at [facility] [address], [place], [place] [address]    RADIOGRAPHIC STUDIES: I have personally reviewed the radiological images as listed and agree with the findings in the report  No results found.  ASSESSMENT/PLAN  Patient is a year old 50 female with symptomatic microcytic anemia presumed to be secondary to dysfunctional uterine bleeding  Anemia:  Most likely etiology is iron deficiency anemia owing to dysfunctional uterine bleeding, exacerbated by high demand owing to prior pregnancies.  Will obtain CBC with diff, CMP, Ferritin, B12, folate, retic count,  DAT, Haptoglobin    Dysfunctional uterine bleeding:  This is characterized by menses that are prolonged, irregular as well as by heavy bleeding with passage of clots.  Will evaluate for possible bleeding disorder with PT, PTT, Fibrinogen, von Willebrand screen.  Has seen gynecology for consideration of hysterectomy for management of uterine fibroids.      Pica:  Secondary to IDA.  Will resolve with resolution of IDA.    Therapeutics:  Since patient has symptomatic anemia with Hgb < 10  and has not tolerated/been compliant with oral iron will arrange for IV iron replacement.   Given the severe symptoms we prefer to replete iron stores in one or two visits rather than over the course of several months.  In addition ongoing blood loss exceeds the capacity of oral iron to meet needs. A discussion regarding risks was had with the patient.  IV iron has the potential to cause allergic reactions, including potentially life-threatening anaphylaxis.   IV iron may be associated with non-allergic infusion reactions including self-limiting urticaria, palpitations, dizziness, and neck and back spasm; generally, these occur in <1 percent of individuals and do not progress to more serious reactions. The non-allergic reaction consisting of flushing of the face and myalgias of the chest and back.   After discussion of the risks and  benefits of IV iron therapy patient has elected to proceed with parenteral iron therapy.          Cancer Staging  No matching staging information was found for the patient.   No problem-specific Assessment & Plan notes found for this encounter.   No orders of the defined types were placed in this encounter.   All questions were answered. The patient knows to call the clinic with any problems, questions or concerns.  This note was electronically signed.    Barbee Cough, MD  07/16/2022 10:03 AM

## 2022-07-19 ENCOUNTER — Inpatient Hospital Stay: Payer: Managed Care, Other (non HMO) | Admitting: Oncology

## 2022-07-19 ENCOUNTER — Inpatient Hospital Stay: Payer: Managed Care, Other (non HMO)

## 2022-08-31 IMAGING — MG DIGITAL DIAGNOSTIC BILAT W/ TOMO W/ CAD
8 series · 8 of 24 positions shown · non-contrast
Comparison: None.

CLINICAL DATA: 34-year-old female presenting for evaluation of
bilateral milky discharge since birth of her child 3 years ago.

EXAM:
DIGITAL DIAGNOSTIC BILATERAL MAMMOGRAM WITH TOMO AND CAD

[L CC synth-2D]
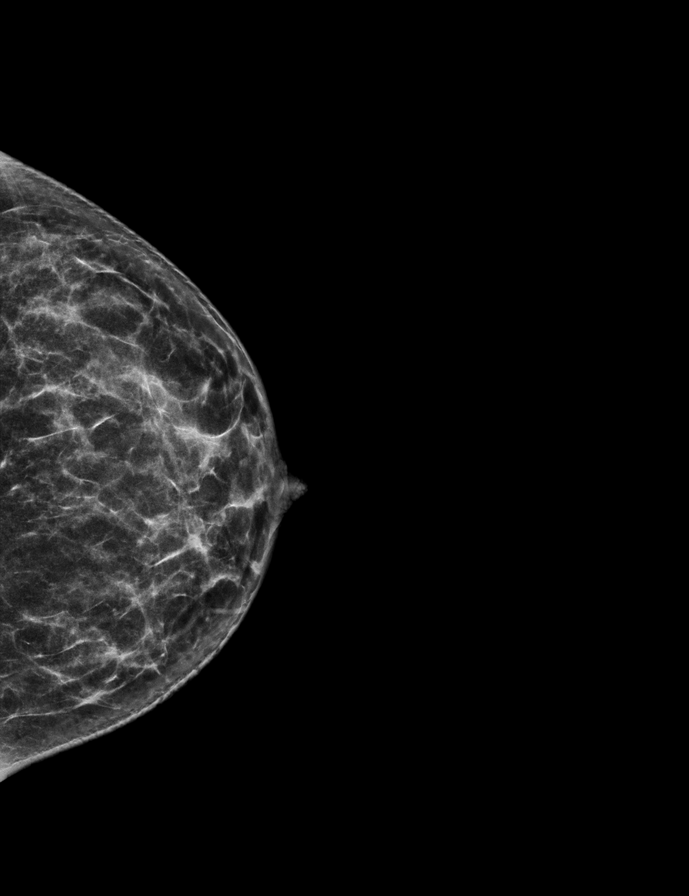

[L MLO synth-2D]
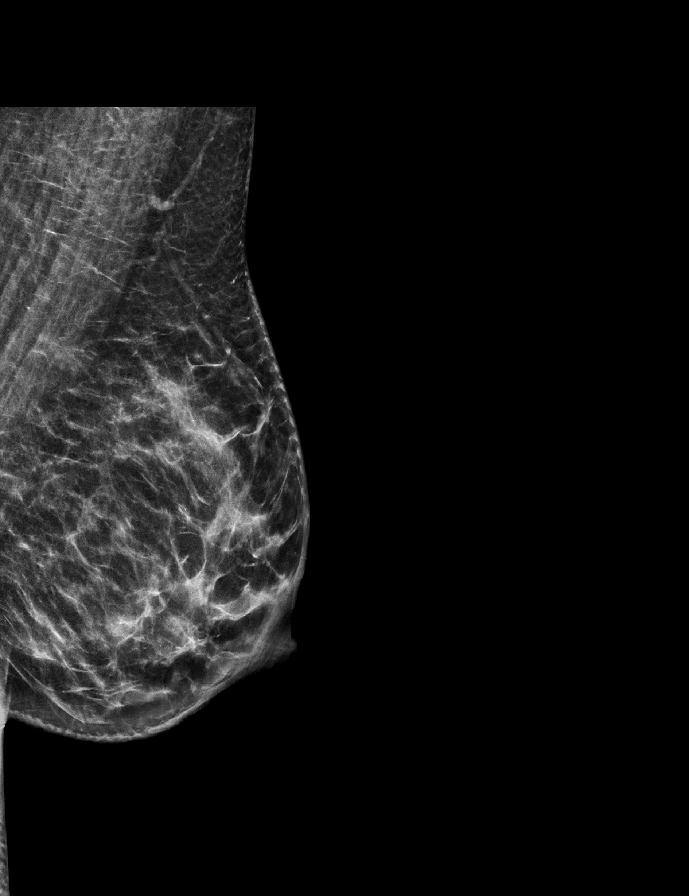

[R CC synth-2D]
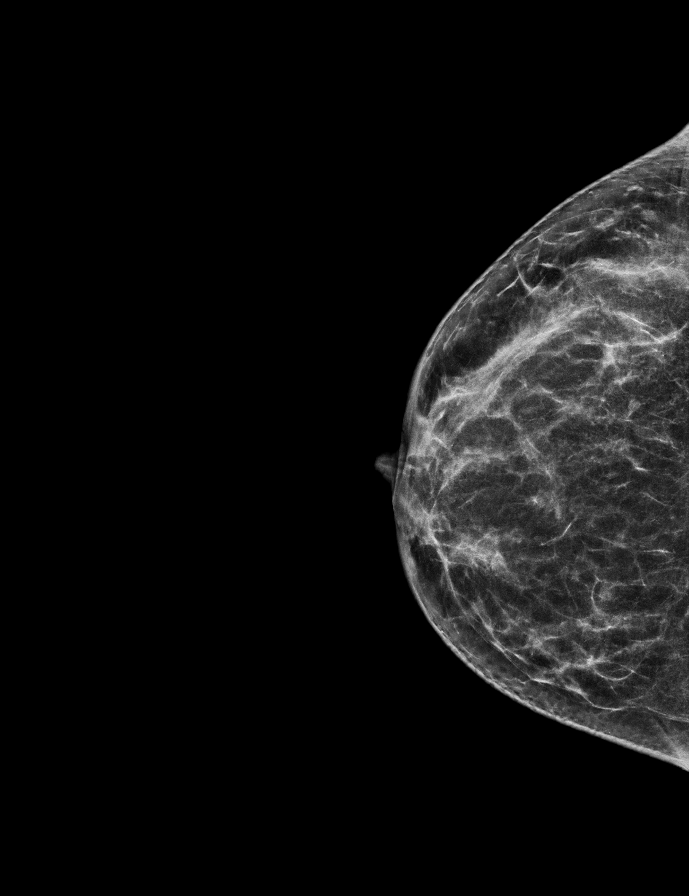

[R MLO synth-2D]
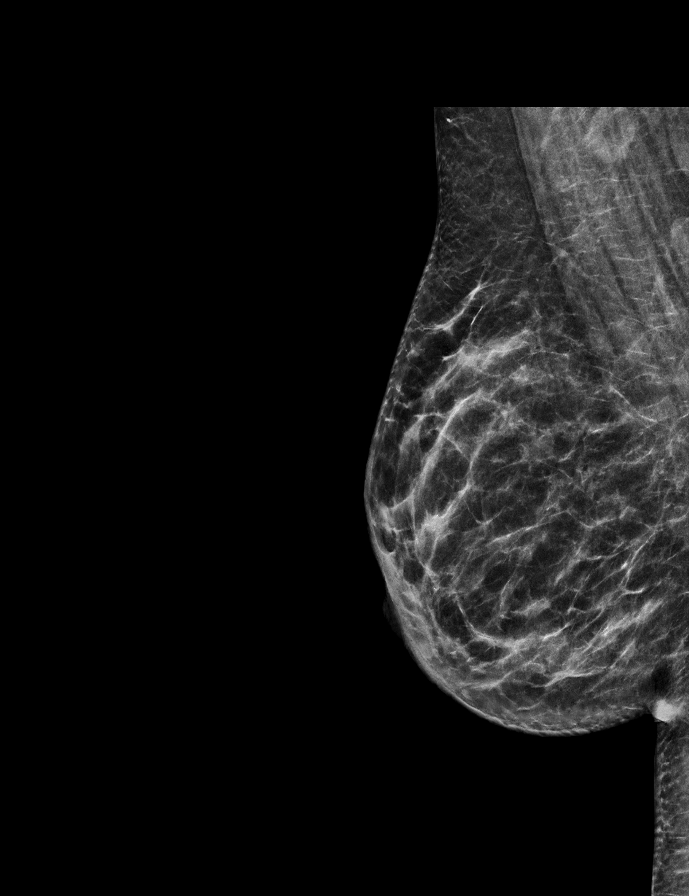

[L MLO tomo · tomo slice 30/59.0]
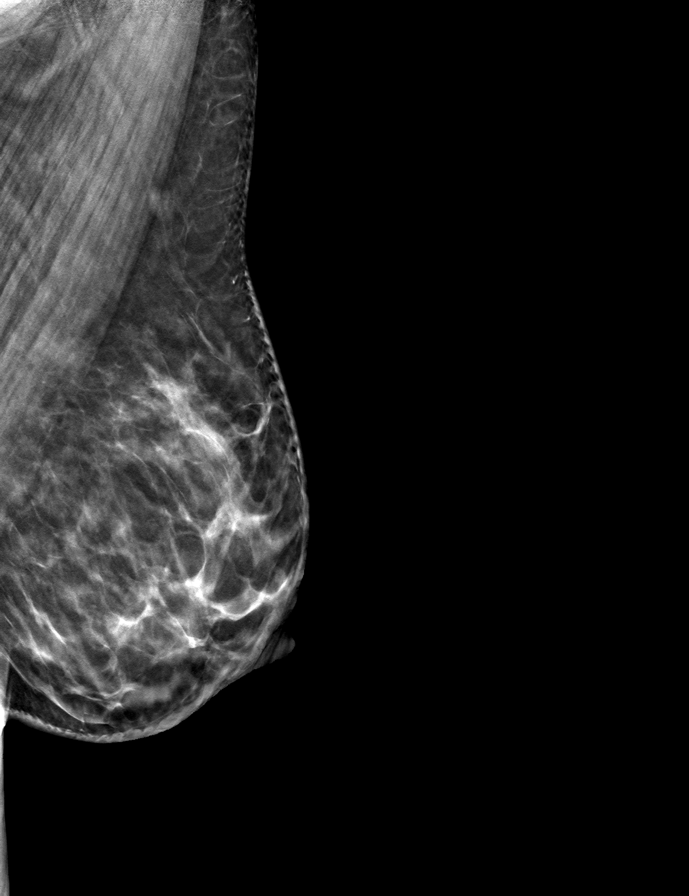

[R CC tomo · tomo slice 28/55.0]
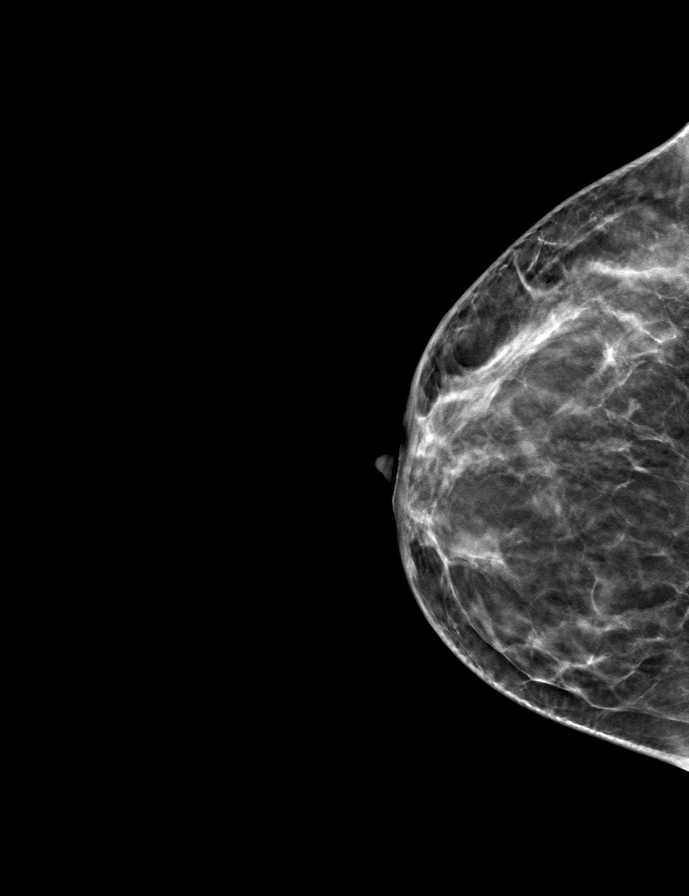

[R MLO tomo · tomo slice 31/62.0]
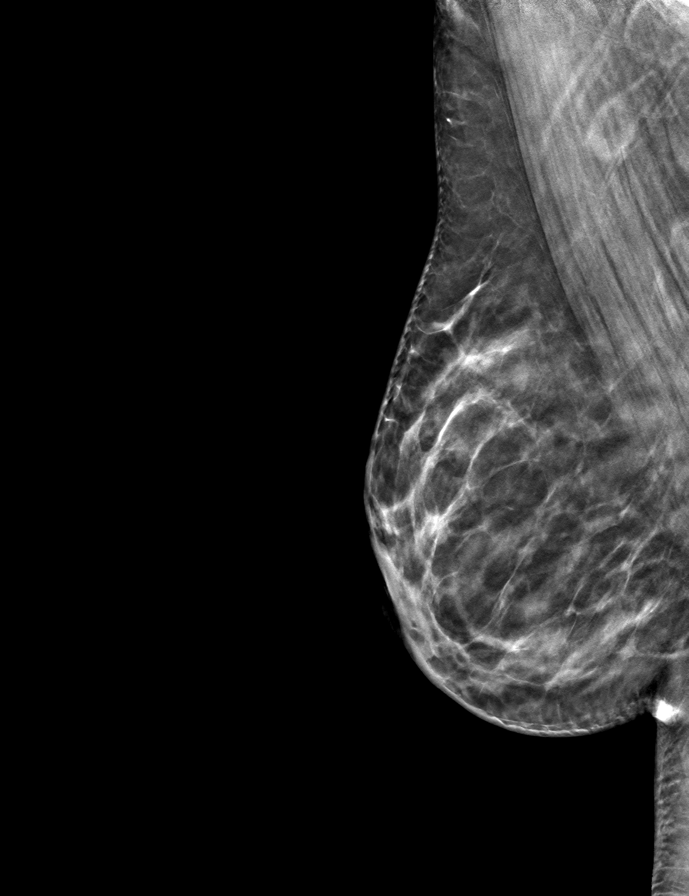

[L CC tomo · tomo slice 27/52.0]
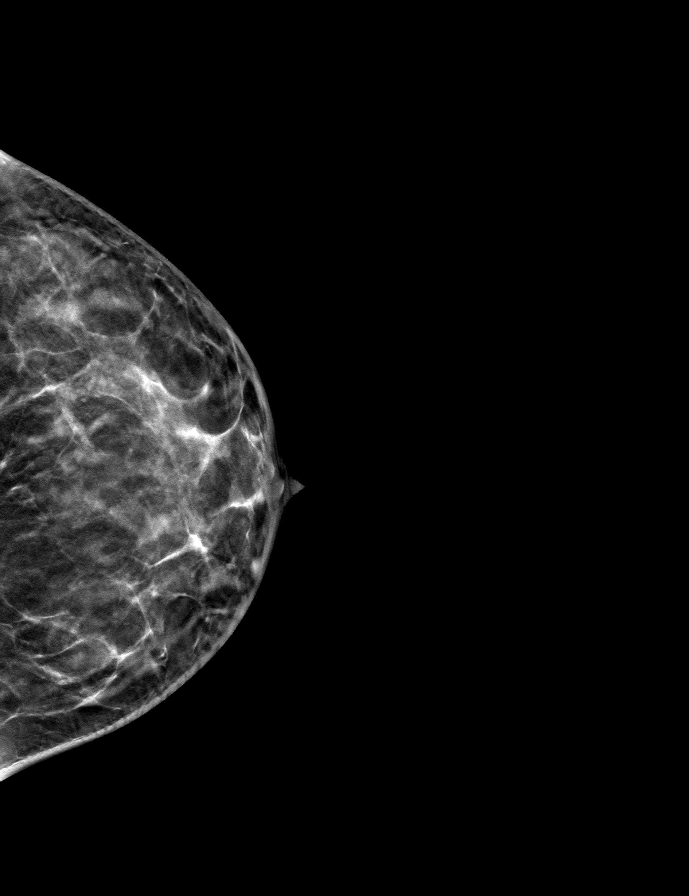

[8 of 24 positions shown; findings below may reference images not displayed]

ACR Breast Density Category c: The breast tissue is heterogeneously
dense, which may obscure small masses.
FINDINGS: No suspicious calcifications, masses or areas of distortion are seen
in the bilateral breasts.

Mammographic images were processed with CAD.
IMPRESSION: 1. There are no mammographic abnormalities in either breast to
explain the patient's bilateral milky nipple discharge.

2. No suspicious calcifications, masses or areas of distortion are
seen in the bilateral breasts.

RECOMMENDATION:
1. Further management of nipple discharge should be based on
clinical assessment. This is felt to be physiologic/related to a
benign process given that it is bilateral and milky in color. The
patient should alert her doctor if she experiences spontaneous
unilateral bloody or clear nipple discharge from a single duct only.

2. Screening mammogram at age 42 unless there are persistent or
intervening clinical concerns. (Code:PP-1-RXK)

I have discussed the findings and recommendations with the patient.
If applicable, a reminder letter will be sent to the patient
regarding the next appointment.

BI-RADS CATEGORY  1: Negative.

## 2022-11-24 NOTE — H&P (Incomplete)
Carrie Cameron is an 37 y.o. [id number] who is admitted for ***.  Patient Active Problem List   Diagnosis Date Noted   Anemia 06/14/2022   Dysfunctional uterine bleeding 06/14/2022   Fibroids 06/14/2022   Coordination of complex care 06/14/2022   Mild dysplasia of cervix (CIN I) 03/04/2022   LGSIL with +HRHPV cervical pap smear on 02/14/22 02/22/2022   Enlarged skin mole 12/11/2020   Pap smear of cervix shows high risk HPV present on 09/27/2020 09/27/2020   Galactorrhea 08/30/2019   History of gestational diabetes 02/11/2018   Overweight (BMI 25.0-29.9) 04/05/2017    Pertinent Gynecological History: Menses: {menses:16152} Bleeding: {uterine bleeding:32112} Contraception: {contraception:5051} Sexually transmitted diseases: {std risk:32110} Previous GYN Procedures: {previous procedures:3041388}  Last mammogram: {normal/abnormal***:32111} Date: *** Last pap: {normal/abnormal***:32111} Date: *** OB History: [id number]   MEDICAL/FAMILY/SOCIAL HX: No LMP recorded.    Past Medical History:  Diagnosis Date   Gestational diabetes    Pregnancy induced hypertension     Past Surgical History:  Procedure Laterality Date   BREAST ENHANCEMENT SURGERY Bilateral    TUBAL LIGATION N/A 05/23/2018   Procedure: BILATERAL TUBAL LIGATION;  Surgeon: Rubie Maid, MD;  Location: [facility] ORS;  Service: Gynecology;  Laterality: N/A;    Family History  Problem Relation Age of Onset   Diabetes Maternal Uncle     Social History:  reports that she has never smoked. She has never used smokeless tobacco. She reports that she does not drink alcohol and does not use drugs.  ALLERGIES/MEDS:  Allergies:  Allergies  Allergen Reactions   Ferrlecit [Na Ferric Gluc Cplx In Sucrose] Hives, Itching and Swelling    Patient had allergic reaction on 07/12/22. Please see progress notes regarding care.    No medications prior to admission.     ROS  There were no vitals taken for this  visit. Physical Exam  No results found for this or any previous visit (from the past 24 hour(s)).  No results found.   ASSESSMENT/PLAN: Carrie Cameron is a 37 y.o. [id number] who is admitted for ***   Drema Dallas, DO

## 2022-12-07 ENCOUNTER — Encounter (HOSPITAL_BASED_OUTPATIENT_CLINIC_OR_DEPARTMENT_OTHER): Payer: Self-pay | Admitting: Obstetrics and Gynecology

## 2022-12-09 ENCOUNTER — Encounter (HOSPITAL_BASED_OUTPATIENT_CLINIC_OR_DEPARTMENT_OTHER): Payer: Self-pay | Admitting: Obstetrics and Gynecology

## 2022-12-09 ENCOUNTER — Other Ambulatory Visit: Payer: Self-pay

## 2022-12-09 NOTE — Progress Notes (Addendum)
Spoke w/ via phone for pre-op interview---[facility] needs dos----urine pregnancy              Lab results------12/16/22 lab appt for cbc, type & screen COVID test -----patient states asymptomatic no test needed Arrive at -------0745 on Wednesday, 12/18/2022 NPO after MN NO Solid Food.  Clear liquids from MN until---0645 Med rec completed Medications to take morning of surgery -----none Diabetic medication -----n/a Patient instructed no nail polish to be worn day of surgery Patient instructed to bring photo id and insurance card day of surgery Patient aware to have Driver (ride ) / caregiver    for 24 hours after surgery - mom, [facility] Patient Special Instructions -----Extended / overnight stay instructions given. Pre-Op special Istructions -----none Patient verbalized understanding of instructions that were given at this phone interview. Patient denies shortness of breath, chest pain, fever, cough at this phone interview.

## 2022-12-09 NOTE — Progress Notes (Signed)
Your procedure is scheduled on Wednesday, 12/18/2022.  Report to [facility].   Call this number if you have problems the morning of surgery  :[phone].   OUR ADDRESS IS [address].  WE ARE LOCATED IN [facility].  PLEASE BRING YOUR INSURANCE CARD AND PHOTO ID DAY OF SURGERY.  ONLY 2 PEOPLE ARE ALLOWED IN  WAITING  ROOM / CURRENTLY NO ONE UNDER AGE 34                                     REMEMBER:  DO NOT EAT FOOD, CANDY GUM OR MINTS  AFTER MIDNIGHT THE NIGHT BEFORE YOUR SURGERY . YOU MAY HAVE CLEAR LIQUIDS FROM MIDNIGHT THE NIGHT BEFORE YOUR SURGERY UNTIL  6:45 AM. NO CLEAR LIQUIDS AFTER   6:45 AM DAY OF SURGERY.  YOU MAY  BRUSH YOUR TEETH MORNING OF SURGERY AND RINSE YOUR MOUTH OUT, NO CHEWING GUM CANDY OR MINTS.     CLEAR LIQUID DIET    Allowed      Water                                                                   Coffee and tea, regular and decaf  (NO cream or milk products of any type, may sweeten)                         Carbonated beverages, regular and diet                                    Sports drinks like Gatorade _____________________________________________________________________     TAKE ONLY THESE MEDICATIONS MORNING OF SURGERY: NONE    UP TO 4 VISITORS  MAY VISIT IN THE EXTENDED RECOVERY ROOM UNTIL 800 PM ONLY.  ONE  VISITOR AGE 21 AND OVER MAY SPEND THE NIGHT AND MUST BE IN EXTENDED RECOVERY ROOM NO LATER THAN 800 PM . YOUR DISCHARGE TIME AFTER YOU SPEND THE NIGHT IS 900 AM THE MORNING AFTER YOUR SURGERY.  YOU MAY PACK A SMALL OVERNIGHT BAG WITH TOILETRIES FOR YOUR OVERNIGHT STAY IF YOU WISH.  YOUR PRESCRIPTION MEDICATIONS WILL BE PROVIDED DURING [place].                                      DO NOT WEAR JEWERLY/  METAL/  PIERCINGS (INCLUDING NO PLASTIC PIERCINGS) DO NOT WEAR LOTIONS, POWDERS, PERFUMES OR NAIL POLISH ON YOUR FINGERNAILS. TOENAIL POLISH IS OK TO WEAR. DO NOT  SHAVE FOR 48 HOURS PRIOR TO DAY OF SURGERY.  CONTACTS, GLASSES, OR DENTURES MAY NOT BE WORN TO SURGERY.  REMEMBER: NO SMOKING, VAPING ,  DRUGS OR ALCOHOL FOR 24 HOURS BEFORE YOUR SURGERY.                                    [facility]  IS NOT RESPONSIBLE  FOR ANY BELONGINGS.                                                                    Marland Kitchen            - Preparing for Surgery Before surgery, you can play an important role.  Because skin is not sterile, your skin needs to be as free of germs as possible.  You can reduce the number of germs on your skin by washing with CHG (chlorahexidine gluconate) soap before surgery.  CHG is an antiseptic cleaner which kills germs and bonds with the skin to continue killing germs even after washing. Please DO NOT use if you have an allergy to CHG or antibacterial soaps.  If your skin becomes reddened/irritated stop using the CHG and inform your nurse when you arrive at Short Stay. Do not shave (including legs and underarms) for at least 48 hours prior to the first CHG shower.  You may shave your face/neck. Please follow these instructions carefully:  1.  Shower with CHG Soap the night before surgery and the  morning of Surgery.  2.  If you choose to wash your hair, wash your hair first as usual with your  normal  shampoo.  3.  After you shampoo, rinse your hair and body thoroughly to remove the  shampoo.                                        4.  Use CHG as you would any other liquid soap.  You can apply chg directly  to the skin and wash , chg soap provided, night before and morning of your surgery.  5.  Apply the CHG Soap to your body ONLY FROM THE NECK DOWN.   Do not use on face/ open                           Wound or open sores. Avoid contact with eyes, ears mouth and genitals (private parts).                       Wash face,  Genitals (private parts) with your normal soap.             6.  Wash thoroughly, paying special attention to the area  where your surgery  will be performed.  7.  Thoroughly rinse your body with warm water from the neck down.  8.  DO NOT shower/wash with your normal soap after using and rinsing off  the CHG Soap.             9.  Pat yourself dry with a clean towel.            10.  Wear clean pajamas.            11.  Place clean sheets on your bed the night of your first shower and do not  sleep with pets. Day of Surgery : Do not apply any lotions/ powders the morning of surgery.  Please wear clean clothes to the hospital/surgery center.  IF YOU HAVE ANY SKIN IRRITATION OR PROBLEMS WITH THE SURGICAL SOAP, PLEASE GET A BAR OF GOLD DIAL SOAP AND SHOWER THE NIGHT BEFORE YOUR SURGERY AND THE MORNING OF YOUR SURGERY. PLEASE LET THE NURSE KNOW MORNING OF YOUR SURGERY IF YOU HAD ANY PROBLEMS WITH THE SURGICAL SOAP.   ________________________________________________________________________                                                        QUESTIONS [facility] PRE OP NURSE PHONE [phone].

## 2022-12-16 ENCOUNTER — Encounter (HOSPITAL_COMMUNITY)
Admission: RE | Admit: 2022-12-16 | Discharge: 2022-12-16 | Disposition: A | Discharge status: Home / Self Care | Payer: Managed Care, Other (non HMO) | Source: Ambulatory Visit | Attending: Obstetrics and Gynecology | Admitting: Obstetrics and Gynecology

## 2022-12-16 DIAGNOSIS — D500 Iron deficiency anemia secondary to blood loss (chronic): Secondary | ICD-10-CM | POA: Insufficient documentation

## 2022-12-16 DIAGNOSIS — N939 Abnormal uterine and vaginal bleeding, unspecified: Secondary | ICD-10-CM | POA: Diagnosis not present

## 2022-12-16 DIAGNOSIS — Z01812 Encounter for preprocedural laboratory examination: Secondary | ICD-10-CM | POA: Insufficient documentation

## 2022-12-16 LAB — CBC
HCT: 28.8 % — ABNORMAL LOW (ref 36.0–46.0)
Hemoglobin: 8.2 g/dL — ABNORMAL LOW (ref 12.0–15.0)
MCH: 20.7 pg — ABNORMAL LOW (ref 26.0–34.0)
MCHC: 28.5 g/dL — ABNORMAL LOW (ref 30.0–36.0)
MCV: 72.7 fL — ABNORMAL LOW (ref 80.0–100.0)
Platelets: 354 10*3/uL (ref 150–400)
RBC: 3.96 MIL/uL (ref 3.87–5.11)
RDW: 16.5 % — ABNORMAL HIGH (ref 11.5–15.5)
WBC: 6 10*3/uL (ref 4.0–10.5)
nRBC: 0 % (ref 0.0–0.2)

## 2022-12-16 NOTE — Progress Notes (Signed)
Patient is scheduled for a hysterectomy with Dr. Delora Fuel at [facility] on 12/18/22. Hgb was 8.2 on 12/16/22. I routed lab work to Dr. Delora Fuel and sent her an Epic IB message. I also let anesthesia know. Per Dr. Valma Cava, since Hgb is above 8 it should be okay to proceed with surgery from an anesthesia perspective.

## 2022-12-18 ENCOUNTER — Encounter (HOSPITAL_BASED_OUTPATIENT_CLINIC_OR_DEPARTMENT_OTHER): Payer: Self-pay | Admitting: Obstetrics and Gynecology

## 2022-12-18 ENCOUNTER — Ambulatory Visit (HOSPITAL_BASED_OUTPATIENT_CLINIC_OR_DEPARTMENT_OTHER): Payer: Managed Care, Other (non HMO) | Admitting: Anesthesiology

## 2022-12-18 ENCOUNTER — Other Ambulatory Visit: Payer: Self-pay

## 2022-12-18 ENCOUNTER — Ambulatory Visit (HOSPITAL_BASED_OUTPATIENT_CLINIC_OR_DEPARTMENT_OTHER)
Admission: RE | Admit: 2022-12-18 | Discharge: 2022-12-18 | Disposition: A | Discharge status: Home / Self Care | Payer: Managed Care, Other (non HMO) | Attending: Obstetrics and Gynecology | Admitting: Obstetrics and Gynecology

## 2022-12-18 ENCOUNTER — Encounter (HOSPITAL_BASED_OUTPATIENT_CLINIC_OR_DEPARTMENT_OTHER)
Admission: RE | Disposition: A | Discharge status: Home / Self Care | Payer: Self-pay | Source: Home / Self Care | Attending: Obstetrics and Gynecology

## 2022-12-18 DIAGNOSIS — D500 Iron deficiency anemia secondary to blood loss (chronic): Secondary | ICD-10-CM | POA: Diagnosis not present

## 2022-12-18 DIAGNOSIS — Z01818 Encounter for other preprocedural examination: Secondary | ICD-10-CM

## 2022-12-18 DIAGNOSIS — N72 Inflammatory disease of cervix uteri: Secondary | ICD-10-CM | POA: Diagnosis not present

## 2022-12-18 DIAGNOSIS — N939 Abnormal uterine and vaginal bleeding, unspecified: Secondary | ICD-10-CM

## 2022-12-18 DIAGNOSIS — Z9851 Tubal ligation status: Secondary | ICD-10-CM | POA: Diagnosis not present

## 2022-12-18 DIAGNOSIS — N870 Mild cervical dysplasia: Secondary | ICD-10-CM | POA: Insufficient documentation

## 2022-12-18 HISTORY — PX: VAGINAL HYSTERECTOMY: SHX2639

## 2022-12-18 HISTORY — DX: Presence of spectacles and contact lenses: Z97.3

## 2022-12-18 HISTORY — DX: Abnormal uterine and vaginal bleeding, unspecified: N93.9

## 2022-12-18 HISTORY — DX: Iron deficiency anemia, unspecified: D50.9

## 2022-12-18 HISTORY — PX: CYSTOSCOPY: SHX5120

## 2022-12-18 LAB — POCT PREGNANCY, URINE: Preg Test, Ur: NEGATIVE

## 2022-12-18 LAB — TYPE AND SCREEN
ABO/RH(D): O POS
Antibody Screen: NEGATIVE

## 2022-12-18 LAB — GLUCOSE, CAPILLARY: Glucose-Capillary: 126 mg/dL — ABNORMAL HIGH (ref 70–99)

## 2022-12-18 SURGERY — HYSTERECTOMY, VAGINAL
Anesthesia: General | Site: Vagina | Laterality: Bilateral

## 2022-12-18 MED ORDER — DOCUSATE SODIUM 100 MG PO CAPS
100.0000 mg | ORAL_CAPSULE | Freq: Two times a day (BID) | ORAL | Status: DC
  Administered 2022-12-18: 100 mg via ORAL

## 2022-12-18 MED ORDER — DEXAMETHASONE SODIUM PHOSPHATE 10 MG/ML IJ SOLN
INTRAMUSCULAR | Status: DC | PRN
  Administered 2022-12-18: 10 mg via INTRAVENOUS

## 2022-12-18 MED ORDER — PHENYLEPHRINE 80 MCG/ML (10ML) SYRINGE FOR IV PUSH (FOR BLOOD PRESSURE SUPPORT)
PREFILLED_SYRINGE | INTRAVENOUS | Status: AC
  Filled 2022-12-18: qty 10

## 2022-12-18 MED ORDER — ACETAMINOPHEN 160 MG/5ML PO SOLN: 325.0000 mg | ORAL | Status: DC | PRN

## 2022-12-18 MED ORDER — PROPOFOL 10 MG/ML IV BOLUS
INTRAVENOUS | Status: DC | PRN
  Administered 2022-12-18: 170 mg via INTRAVENOUS
  Administered 2022-12-18: 30 mg via INTRAVENOUS

## 2022-12-18 MED ORDER — ONDANSETRON HCL 4 MG/2ML IJ SOLN
INTRAMUSCULAR | Status: AC
  Filled 2022-12-18: qty 2

## 2022-12-18 MED ORDER — MIDAZOLAM HCL 5 MG/5ML IJ SOLN
INTRAMUSCULAR | Status: DC | PRN
  Administered 2022-12-18: 2 mg via INTRAVENOUS

## 2022-12-18 MED ORDER — LIDOCAINE-EPINEPHRINE 1 %-1:100000 IJ SOLN
INTRAMUSCULAR | Status: DC | PRN
  Administered 2022-12-18: 30 mL

## 2022-12-18 MED ORDER — MEPERIDINE HCL 25 MG/ML IJ SOLN: 6.2500 mg | INTRAMUSCULAR | Status: DC | PRN

## 2022-12-18 MED ORDER — OXYCODONE HCL 5 MG/5ML PO SOLN: 5.0000 mg | Freq: Once | ORAL | Status: DC | PRN

## 2022-12-18 MED ORDER — CEFAZOLIN SODIUM-DEXTROSE 2-4 GM/100ML-% IV SOLN
2.0000 g | INTRAVENOUS | Status: AC
  Administered 2022-12-18: 2 g via INTRAVENOUS

## 2022-12-18 MED ORDER — ACETAMINOPHEN 500 MG PO TABS: 1000.0000 mg | ORAL_TABLET | Freq: Four times a day (QID) | ORAL | 0 refills | Status: AC | PRN

## 2022-12-18 MED ORDER — DEXAMETHASONE SODIUM PHOSPHATE 10 MG/ML IJ SOLN
INTRAMUSCULAR | Status: AC
  Filled 2022-12-18: qty 1

## 2022-12-18 MED ORDER — FLUORESCEIN SODIUM 10 % IV SOLN
INTRAVENOUS | Status: DC | PRN
  Administered 2022-12-18: 5 mg via INTRAVENOUS

## 2022-12-18 MED ORDER — PROPOFOL 10 MG/ML IV BOLUS
INTRAVENOUS | Status: AC
  Filled 2022-12-18: qty 20

## 2022-12-18 MED ORDER — ACETAMINOPHEN 500 MG PO TABS
ORAL_TABLET | ORAL | Status: AC
  Filled 2022-12-18: qty 2

## 2022-12-18 MED ORDER — FENTANYL CITRATE (PF) 100 MCG/2ML IJ SOLN: 25.0000 ug | INTRAMUSCULAR | Status: DC | PRN

## 2022-12-18 MED ORDER — ONDANSETRON HCL 4 MG/2ML IJ SOLN: 4.0000 mg | Freq: Four times a day (QID) | INTRAMUSCULAR | Status: DC | PRN

## 2022-12-18 MED ORDER — MIDAZOLAM HCL 2 MG/2ML IJ SOLN
INTRAMUSCULAR | Status: AC
  Filled 2022-12-18: qty 2

## 2022-12-18 MED ORDER — KETOROLAC TROMETHAMINE 30 MG/ML IJ SOLN: 30.0000 mg | Freq: Once | INTRAMUSCULAR | Status: DC | PRN

## 2022-12-18 MED ORDER — SIMETHICONE 80 MG PO CHEW: 80.0000 mg | CHEWABLE_TABLET | Freq: Four times a day (QID) | ORAL | Status: DC | PRN

## 2022-12-18 MED ORDER — LACTATED RINGERS IV SOLN: INTRAVENOUS | Status: DC

## 2022-12-18 MED ORDER — KETOROLAC TROMETHAMINE 30 MG/ML IJ SOLN
INTRAMUSCULAR | Status: AC
  Filled 2022-12-18: qty 1

## 2022-12-18 MED ORDER — OXYCODONE HCL 5 MG PO TABS: 5.0000 mg | ORAL_TABLET | Freq: Four times a day (QID) | ORAL | 0 refills | Status: AC | PRN

## 2022-12-18 MED ORDER — FENTANYL CITRATE (PF) 100 MCG/2ML IJ SOLN
INTRAMUSCULAR | Status: AC
  Filled 2022-12-18: qty 2

## 2022-12-18 MED ORDER — ACETAMINOPHEN 325 MG PO TABS: 325.0000 mg | ORAL_TABLET | ORAL | Status: DC | PRN

## 2022-12-18 MED ORDER — DOCUSATE SODIUM 100 MG PO CAPS
ORAL_CAPSULE | ORAL | Status: AC
  Filled 2022-12-18: qty 1

## 2022-12-18 MED ORDER — OXYCODONE HCL 5 MG PO TABS: 5.0000 mg | ORAL_TABLET | Freq: Once | ORAL | Status: DC | PRN

## 2022-12-18 MED ORDER — DEXMEDETOMIDINE HCL IN NACL 80 MCG/20ML IV SOLN
INTRAVENOUS | Status: DC | PRN
  Administered 2022-12-18: 12 ug via BUCCAL

## 2022-12-18 MED ORDER — OXYCODONE HCL 5 MG PO TABS
5.0000 mg | ORAL_TABLET | ORAL | Status: DC | PRN
  Administered 2022-12-18: 10 mg via ORAL

## 2022-12-18 MED ORDER — ROCURONIUM BROMIDE 10 MG/ML (PF) SYRINGE
PREFILLED_SYRINGE | INTRAVENOUS | Status: AC
  Filled 2022-12-18: qty 10

## 2022-12-18 MED ORDER — FENTANYL CITRATE (PF) 100 MCG/2ML IJ SOLN
INTRAMUSCULAR | Status: DC | PRN
  Administered 2022-12-18: 50 ug via INTRAVENOUS
  Administered 2022-12-18 (×2): 25 ug via INTRAVENOUS
  Administered 2022-12-18: 100 ug via INTRAVENOUS

## 2022-12-18 MED ORDER — IBUPROFEN 200 MG PO TABS
600.0000 mg | ORAL_TABLET | Freq: Four times a day (QID) | ORAL | Status: DC
  Administered 2022-12-18: 600 mg via ORAL

## 2022-12-18 MED ORDER — ONDANSETRON HCL 4 MG PO TABS: 4.0000 mg | ORAL_TABLET | Freq: Four times a day (QID) | ORAL | Status: DC | PRN

## 2022-12-18 MED ORDER — OXYCODONE HCL 5 MG PO TABS
ORAL_TABLET | ORAL | Status: AC
  Filled 2022-12-18: qty 2

## 2022-12-18 MED ORDER — MENTHOL 3 MG MT LOZG: 1.0000 | LOZENGE | OROMUCOSAL | Status: DC | PRN

## 2022-12-18 MED ORDER — DEXMEDETOMIDINE HCL IN NACL 80 MCG/20ML IV SOLN
INTRAVENOUS | Status: AC
  Filled 2022-12-18: qty 20

## 2022-12-18 MED ORDER — KETOROLAC TROMETHAMINE 30 MG/ML IJ SOLN
INTRAMUSCULAR | Status: DC | PRN
  Administered 2022-12-18: 30 mg via INTRAVENOUS

## 2022-12-18 MED ORDER — ONDANSETRON HCL 4 MG/2ML IJ SOLN
INTRAMUSCULAR | Status: DC | PRN
  Administered 2022-12-18: 4 mg via INTRAVENOUS

## 2022-12-18 MED ORDER — PHENYLEPHRINE 80 MCG/ML (10ML) SYRINGE FOR IV PUSH (FOR BLOOD PRESSURE SUPPORT)
PREFILLED_SYRINGE | INTRAVENOUS | Status: DC | PRN
  Administered 2022-12-18: 80 ug via INTRAVENOUS
  Administered 2022-12-18: 160 ug via INTRAVENOUS
  Administered 2022-12-18: 80 ug via INTRAVENOUS
  Administered 2022-12-18: 160 ug via INTRAVENOUS
  Administered 2022-12-18: 80 ug via INTRAVENOUS

## 2022-12-18 MED ORDER — ACETAMINOPHEN 500 MG PO TABS
1000.0000 mg | ORAL_TABLET | Freq: Four times a day (QID) | ORAL | Status: DC
  Administered 2022-12-18: 1000 mg via ORAL

## 2022-12-18 MED ORDER — IBUPROFEN 200 MG PO TABS
ORAL_TABLET | ORAL | Status: AC
  Filled 2022-12-18: qty 3

## 2022-12-18 MED ORDER — LIDOCAINE 2% (20 MG/ML) 5 ML SYRINGE
INTRAMUSCULAR | Status: DC | PRN
  Administered 2022-12-18: 100 mg via INTRAVENOUS

## 2022-12-18 MED ORDER — ONDANSETRON HCL 4 MG/2ML IJ SOLN: 4.0000 mg | Freq: Once | INTRAMUSCULAR | Status: DC | PRN

## 2022-12-18 MED ORDER — CEFAZOLIN SODIUM-DEXTROSE 2-4 GM/100ML-% IV SOLN
INTRAVENOUS | Status: AC
  Filled 2022-12-18: qty 100

## 2022-12-18 MED ORDER — MORPHINE SULFATE (PF) 4 MG/ML IV SOLN: 1.0000 mg | INTRAVENOUS | Status: DC | PRN

## 2022-12-18 MED ORDER — IBUPROFEN 600 MG PO TABS: 600.0000 mg | ORAL_TABLET | Freq: Four times a day (QID) | ORAL | 1 refills | Status: AC | PRN

## 2022-12-18 SURGICAL SUPPLY — 30 items
COVER MAYO STAND STRL (DRAPES) ×2 IMPLANT
DRAPE SURG IRRIG POUCH 19X23 (DRAPES) ×2 IMPLANT
GAUZE 4X4 16PLY ~~LOC~~+RFID DBL (SPONGE) ×2 IMPLANT
GLOVE BIOGEL M 6.5 STRL (GLOVE) ×4 IMPLANT
GLOVE BIOGEL PI IND STRL 6.5 (GLOVE) ×2 IMPLANT
GLOVE BIOGEL PI IND STRL 7.0 (GLOVE) ×2 IMPLANT
GOWN STRL REUS W/ TWL LRG LVL3 (GOWN DISPOSABLE) ×4 IMPLANT
GOWN STRL REUS W/TWL LRG LVL3 (GOWN DISPOSABLE) ×4
HEMOSTAT ARISTA ABSORB 3G PWDR (HEMOSTASIS) IMPLANT
KIT TURNOVER CYSTO (KITS) ×2 IMPLANT
LIGASURE IMPACT 36 18CM CVD LR (INSTRUMENTS) IMPLANT
NS IRRIG 1000ML POUR BTL (IV SOLUTION) ×2 IMPLANT
PACK VAGINAL WOMENS (CUSTOM PROCEDURE TRAY) ×2 IMPLANT
PAD OB MATERNITY 4.3X12.25 (PERSONAL CARE ITEMS) ×2 IMPLANT
SET CYSTO W/LG BORE CLAMP LF (SET/KITS/TRAYS/PACK) IMPLANT
SET IRRIG Y TYPE TUR BLADDER L (SET/KITS/TRAYS/PACK) IMPLANT
SLEEVE SCD COMPRESS KNEE MED (STOCKING) ×2 IMPLANT
SPIKE FLUID TRANSFER (MISCELLANEOUS) IMPLANT
SUT CHROMIC 2 0 CT 1 (SUTURE) IMPLANT
SUT VIC AB 0 CT1 27 (SUTURE) ×4
SUT VIC AB 0 CT1 27XCR 8 STRN (SUTURE) ×4 IMPLANT
SUT VIC AB 0 CT1 36 (SUTURE) IMPLANT
SUT VIC AB 2-0 CT1 (SUTURE) IMPLANT
SUT VIC AB 2-0 SH 27 (SUTURE) ×4
SUT VIC AB 2-0 SH 27XBRD (SUTURE) IMPLANT
SUT VICRYL 1 TIES 12X18 (SUTURE) IMPLANT
SYR BULB IRRIG 60ML STRL (SYRINGE) ×2 IMPLANT
TOWEL OR 17X24 6PK STRL BLUE (TOWEL DISPOSABLE) ×2 IMPLANT
TRAY FOLEY W/BAG SLVR 14FR LF (SET/KITS/TRAYS/PACK) ×2 IMPLANT
UNDERPAD 30X36 HEAVY ABSORB (UNDERPADS AND DIAPERS) ×2 IMPLANT

## 2022-12-18 NOTE — Op Note (Signed)
Pre Op Dx:   1. Abnormal uterine bleeding 2. Iron deficiency anemia due to chronic blood loss  Post Op Dx:   Same as pre-operative diagnoses  Procedure: 1. Total Vaginal Hysterectomy and Bilateral Salpingectomy 2. Cystoscopy  Surgeon:  Dr. Wellington Hampshire. Delora Fuel Assistants:  Dr. Christophe Louis (assistant needed due to the complexity of the anatomy) Anesthesia:  LMA  EBL:  100cc  IVF:  1000cc UOP:  50cc clear yellow urine  Drains:  Foley catheter Specimen removed:  Uterus, cervix, and bilateral fallopian tubes - sent to pathology Device(s) implanted:  None Case Type:  Clean-contaminated Findings: Normal-appearing, multiparous cervix. Normal-appearing uterus and bilateral fallopian tubes. Evidence of prior BTL. Ovaries appeared normal. On cystoscopy, a white-appearing plaque in the area of the trigone was noted and what appeared to be a small polyp on the right. Bilateral ureteral jets were present. Complications: None Indications:  37 y.o. G8P6 s/p BTL with AUB and chronic blood loss anemia who has failed medical management of AUB and desires definitive hysterectomy.  Description of procedure: After informed consent was obtained the patient was brought to the operating room.  She was placed in dorsal supine position and anesthesia was administered.  She was placed in dorsal lithotomy position and prepped and draped in the usual sterile fashion.  A Foley catheter was placed.  A preoperative timeout was completed.  The cervix was grasped and the vaginal epithelium injected with local anesthetic.  A circumferential colpotomy was created using electrosurgery.  The epithelium was divided further with scissors and blunt dissection.  The posterior cul-de-sac was entered sharply. The Steiner weighted retractor was placed. The anterior vesicouterine peritoneal reflection was identified and opened sharply.  The Deaver retractor was placed.  The uterosacral ligaments were divided and suture ligated with 0  Vicryl.  The cardinal ligaments with the uterine vasculature were divided and ligated using the Ligasure device. Sequential pedicles of the broad ligament were divided using the Ligasure device in stepwise fashion. The cornual pedicles containing the uteroovarian anastomosis, fallopian tube, and round ligament were each divided using the Ligasure device.  The uterus was removed and passed off the field.  The fallopian tubes were elevated ligated at the mesosalpinx using the Ligasure.  Both tubes were removed.  The ovaries appeared normal. Two small areas of bleeding were noted on each sidewall which was made hemostatic with multiple figure-of-eight sutures and electrosurgery. Hemostasis verified. The uterosacral ligaments were plicated to their ipsilateral vaginal cuff.  The cuff was closed in a running fashion using 0-Vicryl. Irrigation was performed and hemostasis verified. Fluorescein was given IV prior to cystoscopy. Cystoscopy was performed and demonstrated intact urothelium throughout the bladder, a normal-appearing trigone with a white plaque in the region and bilaterally patent ureteral orifices with normal urine jets noted. A small polyp was also noted on the right side of the bladder.  She was returned to dorsal supine position, awakened and extubated having appeared to tolerate the procedure well.  All counts were correct.  She was transferred to PACU in good condition.   Disposition:  PACU  Drema Dallas, DO

## 2022-12-18 NOTE — Anesthesia Postprocedure Evaluation (Signed)
Anesthesia Post Note  Patient: Carrie Cameron  Procedure(s) Performed: HYSTERECTOMY VAGINAL WITH BILATERAL SALPINGECTOMY (Bilateral: Vagina ) CYSTOSCOPY (Bladder)     Anesthesia Type: General Level of consciousness: awake Pain management: satisfactory to patient Vital Signs Assessment: post-procedure vital signs reviewed and stable Respiratory status: spontaneous breathing Cardiovascular status: stable Postop Assessment: no apparent nausea or vomiting Anesthetic complications: no  No notable events documented.  Last Vitals:  Vitals:   12/18/22 1135 12/18/22 1145  BP: 108/68 100/63  Pulse: 82 77  Resp: 13 13  Temp: 36.7 C   SpO2: 95% 94%    Last Pain:  Vitals:   12/18/22 1224  TempSrc:   PainSc: [place] Jr

## 2022-12-18 NOTE — Interval H&P Note (Signed)
History and Physical Interval Note:  12/18/2022 8:53 AM  Carrie Cameron  has presented today for surgery, with the diagnosis of Abnormal Vaginal Bleeding.  The various methods of treatment have been discussed with the patient and family. After consideration of risks, benefits and other options for treatment, the patient has consented to  Procedure(s): HYSTERECTOMY VAGINAL WITH BILATERAL SALPINGECTOMY (Bilateral) as a surgical intervention.  The patient's history has been reviewed, patient examined, no change in status, stable for surgery.  I have reviewed the patient's chart and labs.  Questions were answered to the patient's satisfaction.     Drema Dallas

## 2022-12-18 NOTE — Anesthesia Procedure Notes (Signed)
Procedure Name: LMA Insertion Date/Time: 12/18/2022 9:30 AM  Performed by: Rogers Blocker, CRNAPre-anesthesia Checklist: Patient identified, Emergency Drugs available, Suction available and Patient being monitored Patient Re-evaluated:Patient Re-evaluated prior to induction Oxygen Delivery Method: Circle System Utilized Preoxygenation: Pre-oxygenation with 100% oxygen Induction Type: IV induction Ventilation: Mask ventilation without difficulty LMA: LMA inserted LMA Size: 4.0 Number of attempts: 1 Airway Equipment and Method: Bite block Placement Confirmation: positive ETCO2 Tube secured with: Tape Dental Injury: Teeth and Oropharynx as per pre-operative assessment

## 2022-12-18 NOTE — Transfer of Care (Signed)
Immediate Anesthesia Transfer of Care Note  Patient: Carrie Cameron  Procedure(s) Performed: HYSTERECTOMY VAGINAL WITH BILATERAL SALPINGECTOMY (Bilateral: Vagina ) CYSTOSCOPY (Bladder)  Patient Location: PACU  Anesthesia Type:General  Level of Consciousness: drowsy, patient cooperative, and responds to stimulation  Airway & Oxygen Therapy: Patient Spontanous Breathing  Post-op Assessment: Report given to RN and Post -op Vital signs reviewed and stable  Post vital signs: Reviewed and stable  Last Vitals:  Vitals Value Taken Time  BP 108/68 12/18/22 1135  Temp    Pulse 77 12/18/22 1137  Resp 12 12/18/22 1137  SpO2 95 % 12/18/22 1137  Vitals shown include unvalidated device data.  Last Pain:  Vitals:   12/18/22 0829  TempSrc: Oral  PainSc: 0-No pain      Patients Stated Pain Goal: 6 ([id number])  Complications: No notable events documented.

## 2022-12-18 NOTE — Anesthesia Preprocedure Evaluation (Addendum)
Anesthesia Evaluation  Patient identified by MRN, date of birth, ID band Patient awake    Reviewed: Allergy & Precautions, NPO status , Patient's Chart, lab work & pertinent test results  History of Anesthesia Complications Negative for: history of anesthetic complications  Airway Mallampati: I       Dental no notable dental hx. (+) Teeth Intact   Pulmonary neg pulmonary ROS, neg sleep apnea, neg COPD, Not current smoker   Pulmonary exam normal        Cardiovascular (-) Past MI and (-) CHF Normal cardiovascular exam(-) dysrhythmias (-) Valvular Problems/Murmurs     Neuro/Psych neg Seizures negative neurological ROS  negative psych ROS   GI/Hepatic negative GI ROS, Neg liver ROS,neg GERD  ,,  Endo/Other    Renal/GU negative Renal ROS  negative genitourinary   Musculoskeletal negative musculoskeletal ROS (+)    Abdominal Normal abdominal exam  (+)   Peds  Hematology  (+) Blood dyscrasia, anemia   Anesthesia Other Findings   Reproductive/Obstetrics negative OB ROS                             Anesthesia Physical Anesthesia Plan  ASA: 2  Anesthesia Plan: General   Post-op Pain Management:    Induction: Intravenous  PONV Risk Score and Plan: 4 or greater and Dexamethasone, Ondansetron and Midazolam  Airway Management Planned: LMA  Additional Equipment: None  Intra-op Plan:   Post-operative Plan: Extubation in OR  Informed Consent: I have reviewed the patients History and Physical, chart, labs and discussed the procedure including the risks, benefits and alternatives for the proposed anesthesia with the patient or authorized representative who has indicated his/her understanding and acceptance.     Dental advisory given  Plan Discussed with: CRNA  Anesthesia Plan Comments:         Anesthesia Quick Evaluation

## 2022-12-18 NOTE — Discharge Summary (Signed)
Physician Discharge Summary  Patient ID: Carrie Cameron MRN: [id number] DOB/AGE: 1985/11/25 37 y.o.  Admit date: 12/18/2022 Discharge date: 12/18/2022  Admission Diagnoses: 1. Abnormal uterine bleeding 2. Iron deficiency anemia due to chronic blood loss  Discharge Diagnoses:  Principal Problem:   Abnormal uterine bleeding (AUB) 2. Iron deficiency anemia due to chronic blood loss  Procedure(s):  1. Total Vaginal Hysterectomy and Bilateral Salpingectomy 2. Cystoscopy  Discharged Condition: good  Hospital Course: Patient was admitted on 12/18/2022 for the above named procedure(s) for the above named diagnoses. Prior to hospital discharge, patient was tolerating PO, ambulating, voiding spontaneously, and pain was well-controlled. See hospital chart for specific details. Patient was discharged home in stable condition.  Consults: None  Significant Diagnostic Studies: None  Treatments: surgery: As above  Discharge Exam: Blood pressure 108/68, pulse 80, temperature (!) 97.5 F (36.4 C), resp. rate 14, height 5' (1.524 m), weight 70.8 kg, last menstrual period 11/26/2022, SpO2 98 %.  Disposition: Discharge disposition: 01-Home or Self Care       Discharge Instructions     Call MD for:  difficulty breathing, headache or visual disturbances   Complete by: As directed    Call MD for:  extreme fatigue   Complete by: As directed    Call MD for:  hives   Complete by: As directed    Call MD for:  persistant dizziness or light-headedness   Complete by: As directed    Call MD for:  persistant nausea and vomiting   Complete by: As directed    Call MD for:  severe uncontrolled pain   Complete by: As directed    Call MD for:  temperature >100.4   Complete by: As directed    Diet - low sodium heart healthy   Complete by: As directed    Driving Restrictions   Complete by: As directed    No driving while taking narcotic medications. No driving for at least 2 weeks.    Increase activity slowly   Complete by: As directed    Lifting restrictions   Complete by: As directed    No lifting greater than 10lbs for at least 6 weeks.   Sexual Activity Restrictions   Complete by: As directed    No sexual intercourse for at least 6 weeks.      Allergies as of 12/18/2022       Reactions   Ferrlecit [na Ferric Gluc Cplx In Sucrose] Hives, Itching, Swelling   Patient had allergic reaction on 07/12/22. Please see progress notes regarding care.   Latex Rash   Rash at contact site only.        Medication List     TAKE these medications    acetaminophen 500 MG tablet Commonly known as: TYLENOL Take 2 tablets (1,000 mg total) by mouth every 6 (six) hours as needed for up to 7 days for moderate pain or mild pain.   ibuprofen 600 MG tablet Commonly known as: ADVIL Take 1 tablet (600 mg total) by mouth every 6 (six) hours as needed for moderate pain or mild pain.   methocarbamol 500 MG tablet Commonly known as: ROBAXIN Take 500 mg by mouth at bedtime as needed.   multivitamin tablet Take 1 tablet by mouth daily.   oxyCODONE 5 MG immediate release tablet Commonly known as: Oxy IR/ROXICODONE Take 1 tablet (5 mg total) by mouth every 6 (six) hours as needed for severe pain or breakthrough pain.  Follow-up Information     Drema Dallas, DO Follow up in 2 week(s).   Specialty: Obstetrics and Gynecology Why: Please keep yoru 2 week and 6 week post-operative visit as previously scheduled. Contact information: [address] [place] [address] [phone]                 Signed: Drema Dallas 12/18/2022, 4:38 PM

## 2022-12-19 ENCOUNTER — Encounter (HOSPITAL_BASED_OUTPATIENT_CLINIC_OR_DEPARTMENT_OTHER): Payer: Self-pay | Admitting: Obstetrics and Gynecology

## 2022-12-20 LAB — SURGICAL PATHOLOGY

## 2022-12-26 ENCOUNTER — Ambulatory Visit: Payer: Managed Care, Other (non HMO)

## 2023-10-23 ENCOUNTER — Encounter: Payer: Self-pay | Admitting: Oncology
